# Patient Record
Sex: Female | Born: 1965 | Race: White | Hispanic: No | Smoking: Never smoker
Health system: Southern US, Community
[De-identification: ages and names within clinical notes are randomized; demographics above are authoritative.]

## PROBLEM LIST (undated history)

## (undated) DIAGNOSIS — Z801 Family history of malignant neoplasm of trachea, bronchus and lung: Secondary | ICD-10-CM

## (undated) DIAGNOSIS — D0511 Intraductal carcinoma in situ of right breast: Secondary | ICD-10-CM

## (undated) DIAGNOSIS — E119 Type 2 diabetes mellitus without complications: Secondary | ICD-10-CM

## (undated) DIAGNOSIS — F419 Anxiety disorder, unspecified: Secondary | ICD-10-CM

## (undated) DIAGNOSIS — Z808 Family history of malignant neoplasm of other organs or systems: Secondary | ICD-10-CM

## (undated) DIAGNOSIS — K589 Irritable bowel syndrome without diarrhea: Secondary | ICD-10-CM

## (undated) DIAGNOSIS — Z803 Family history of malignant neoplasm of breast: Secondary | ICD-10-CM

## (undated) DIAGNOSIS — D0512 Intraductal carcinoma in situ of left breast: Secondary | ICD-10-CM

## (undated) HISTORY — DX: Intraductal carcinoma in situ of right breast: D05.11

## (undated) HISTORY — DX: Family history of malignant neoplasm of trachea, bronchus and lung: Z80.1

## (undated) HISTORY — DX: Intraductal carcinoma in situ of left breast: D05.12

## (undated) HISTORY — DX: Irritable bowel syndrome, unspecified: K58.9

## (undated) HISTORY — DX: Family history of malignant neoplasm of breast: Z80.3

## (undated) HISTORY — DX: Family history of malignant neoplasm of other organs or systems: Z80.8

## (undated) HISTORY — DX: Type 2 diabetes mellitus without complications: E11.9

---

## 2002-12-23 ENCOUNTER — Other Ambulatory Visit: Admission: RE | Admit: 2002-12-23 | Discharge: 2002-12-23 | Payer: Self-pay | Admitting: *Deleted

## 2006-03-12 ENCOUNTER — Ambulatory Visit: Payer: Self-pay | Admitting: Orthopedic Surgery

## 2007-02-15 ENCOUNTER — Ambulatory Visit (HOSPITAL_COMMUNITY): Admission: RE | Admit: 2007-02-15 | Discharge: 2007-02-15 | Payer: Self-pay | Admitting: Obstetrics & Gynecology

## 2008-02-25 ENCOUNTER — Other Ambulatory Visit: Admission: RE | Admit: 2008-02-25 | Discharge: 2008-02-25 | Payer: Self-pay | Admitting: Obstetrics & Gynecology

## 2009-04-19 ENCOUNTER — Other Ambulatory Visit: Admission: RE | Admit: 2009-04-19 | Discharge: 2009-04-19 | Payer: Self-pay | Admitting: Obstetrics & Gynecology

## 2010-05-20 ENCOUNTER — Ambulatory Visit (HOSPITAL_COMMUNITY): Admission: RE | Admit: 2010-05-20 | Discharge: 2010-05-20 | Payer: Self-pay | Admitting: Obstetrics & Gynecology

## 2010-07-08 ENCOUNTER — Other Ambulatory Visit: Admission: RE | Admit: 2010-07-08 | Discharge: 2010-07-08 | Payer: Self-pay | Admitting: Obstetrics & Gynecology

## 2010-12-05 ENCOUNTER — Encounter: Payer: Self-pay | Admitting: Obstetrics & Gynecology

## 2016-06-26 ENCOUNTER — Encounter: Payer: Self-pay | Admitting: Nutrition

## 2016-06-26 ENCOUNTER — Encounter: Payer: BC Managed Care – PPO | Attending: Internal Medicine | Admitting: Nutrition

## 2016-06-26 VITALS — Ht 65.0 in | Wt 195.8 lb

## 2016-06-26 DIAGNOSIS — E1165 Type 2 diabetes mellitus with hyperglycemia: Secondary | ICD-10-CM

## 2016-06-26 DIAGNOSIS — E119 Type 2 diabetes mellitus without complications: Secondary | ICD-10-CM | POA: Diagnosis not present

## 2016-06-26 DIAGNOSIS — Z6832 Body mass index (BMI) 32.0-32.9, adult: Secondary | ICD-10-CM | POA: Diagnosis not present

## 2016-06-26 DIAGNOSIS — Z713 Dietary counseling and surveillance: Secondary | ICD-10-CM | POA: Diagnosis present

## 2016-06-26 DIAGNOSIS — E669 Obesity, unspecified: Secondary | ICD-10-CM

## 2016-06-26 DIAGNOSIS — IMO0002 Reserved for concepts with insufficient information to code with codable children: Secondary | ICD-10-CM

## 2016-06-26 DIAGNOSIS — E118 Type 2 diabetes mellitus with unspecified complications: Secondary | ICD-10-CM

## 2016-06-26 NOTE — Progress Notes (Signed)
Diabetes Self-Management Education  Visit Type:  Initial   Appt. Start Time: 800 Appt. End Time: 900  06/26/2016  Maureen Mahoney, identified by name and date of birth, is a 50 y.o. female with a diagnosis of Diabetes:  . Works in the school system. She notes she has made a lot of changes recently. Lost 10 lbs. Has cut back on snacks and junk food and drinking more water and trying to drink more water. Tests blood sugars in am sometimes. A1C   ASSESSMENT  Height 5\' 5"  (1.651 m), weight 195 lb 12.8 oz (88.8 kg). Body mass index is 32.58 kg/m.      Diabetes Self-Management Education - 06/26/16 0818      Health Coping   How would you rate your overall health? Fair     Psychosocial Assessment   Patient Belief/Attitude about Diabetes Motivated to manage diabetes   Self-care barriers None   Self-management support Friends;Family   Other persons present Patient   Patient Concerns Nutrition/Meal planning;Medication;Monitoring;Problem Solving;Glycemic Control;Healthy Lifestyle   Special Needs None   Preferred Learning Style Visual;Auditory   Learning Readiness Ready   How often do you need to have someone help you when you read instructions, pamphlets, or other written materials from your doctor or pharmacy? 1 - Never   What is the last grade level you completed in school? 18     Pre-Education Assessment   Patient understands the diabetes disease and treatment process. Needs Instruction   Patient understands incorporating nutritional management into lifestyle. Needs Instruction   Patient undertands incorporating physical activity into lifestyle. Needs Instruction   Patient understands using medications safely. Needs Instruction   Patient understands monitoring blood glucose, interpreting and using results Needs Instruction   Patient understands prevention, detection, and treatment of acute complications. Needs Instruction   Patient understands prevention, detection, and  treatment of chronic complications. Needs Instruction   Patient understands how to develop strategies to address psychosocial issues. Needs Instruction   Patient understands how to develop strategies to promote health/change behavior. Needs Instruction     Complications   Last HgB A1C per patient/outside source 8.3 %     Exercise   How many days per week to you exercise? 2   How many minutes per day do you exercise? 30   Total minutes per week of exercise 60     Patient Education   Previous Diabetes Education No   Disease state  Definition of diabetes, type 1 and 2, and the diagnosis of diabetes;Factors that contribute to the development of diabetes;Explored patient's options for treatment of their diabetes   Nutrition management  Role of diet in the treatment of diabetes and the relationship between the three main macronutrients and blood glucose level;Food label reading, portion sizes and measuring food.;Carbohydrate counting;Reviewed blood glucose goals for pre and post meals and how to evaluate the patients' food intake on their blood glucose level.;Meal timing in regards to the patients' current diabetes medication.;Information on hints to eating out and maintain blood glucose control.   Physical activity and exercise  Role of exercise on diabetes management, blood pressure control and cardiac health.;Identified with patient nutritional and/or medication changes necessary with exercise.;Helped patient identify appropriate exercises in relation to his/her diabetes, diabetes complications and other health issue.   Medications Reviewed patients medication for diabetes, action, purpose, timing of dose and side effects.   Monitoring Purpose and frequency of SMBG.;Taught/discussed recording of test results and interpretation of SMBG.;Interpreting lab values - A1C, lipid, urine  microalbumina.;Identified appropriate SMBG and/or A1C goals.   Acute complications Taught treatment of hypoglycemia - the  15 rule.;Discussed and identified patients' treatment of hyperglycemia.   Chronic complications Relationship between chronic complications and blood glucose control;Assessed and discussed foot care and prevention of foot problems;Lipid levels, blood glucose control and heart disease;Identified and discussed with patient  current chronic complications;Retinopathy and reason for yearly dilated eye exams;Nephropathy, what it is, prevention of, the use of ACE, ARB's and early detection of through urine microalbumia.;Reviewed with patient heart disease, higher risk of, and prevention   Psychosocial adjustment Worked with patient to identify barriers to care and solutions;Role of stress on diabetes;Helped patient identify a support system for diabetes management;Identified and addressed patients feelings and concerns about diabetes   Personal strategies to promote health Lifestyle issues that need to be addressed for better diabetes care;Helped patient develop diabetes management plan for (enter comment)     Individualized Goals (developed by patient)   Nutrition Follow meal plan discussed;General guidelines for healthy choices and portions discussed;Adjust meds/carbs with exercise as discussed   Physical Activity Exercise 3-5 times per week;45 minutes per day   Medications take my medication as prescribed   Monitoring  test my blood glucose as discussed;test blood glucose pre and post meals as discussed   Reducing Risk examine blood glucose patterns;get labs drawn;do foot checks daily;increase portions of healthy fats   Health Coping ask for help with (comment)  carb counting     Post-Education Assessment   Patient understands the diabetes disease and treatment process. Needs Review   Patient understands incorporating nutritional management into lifestyle. Needs Review   Patient undertands incorporating physical activity into lifestyle. Needs Review   Patient understands using medications safely. Needs  Review   Patient understands monitoring blood glucose, interpreting and using results Needs Review   Patient understands prevention, detection, and treatment of acute complications. Needs Review   Patient understands prevention, detection, and treatment of chronic complications. Needs Review   Patient understands how to develop strategies to address psychosocial issues. Needs Review   Patient understands how to develop strategies to promote health/change behavior. Needs Review     Outcomes   Expected Outcomes Demonstrated interest in learning. Expect positive outcomes   Future DMSE 4-6 wks   Program Status Completed      Individualized Plan for Diabetes Self-Management Training:   Learning Objective:  Patient will have a greater understanding of diabetes self-management. Patient education plan is to attend individual and/or group sessions per assessed needs and concerns.   Plan:   Patient Instructions  Goals Follow Plate Method Eat 2-3 carb choices per meal Exercise 30 minutes three times per week. Choose healthier option for breakfast Get A1C to 7%  Lose 1 -2 lbs per week   Expected Outcomes:  Demonstrated interest in learning. Expect positive outcomes  Education material provided: Living Well with Diabetes, Food label handouts, A1C conversion sheet, Meal plan card and My Plate  If problems or questions, patient to contact team via:  Phone and Email  Future DSME appointment: 4-6 wks

## 2016-06-26 NOTE — Patient Instructions (Signed)
Goals Follow Plate Method Eat 2-3 carb choices per meal Exercise 30 minutes three times per week. Choose healthier option for breakfast Get A1C to 7%  Lose 1 -2 lbs per week

## 2016-07-12 ENCOUNTER — Encounter: Payer: Self-pay | Admitting: Nutrition

## 2016-07-24 ENCOUNTER — Encounter: Payer: Self-pay | Admitting: Nutrition

## 2016-07-24 ENCOUNTER — Encounter: Payer: BC Managed Care – PPO | Attending: Internal Medicine | Admitting: Nutrition

## 2016-07-24 VITALS — Ht 65.0 in | Wt 194.0 lb

## 2016-07-24 DIAGNOSIS — Z6832 Body mass index (BMI) 32.0-32.9, adult: Secondary | ICD-10-CM | POA: Insufficient documentation

## 2016-07-24 DIAGNOSIS — E1165 Type 2 diabetes mellitus with hyperglycemia: Secondary | ICD-10-CM

## 2016-07-24 DIAGNOSIS — Z713 Dietary counseling and surveillance: Secondary | ICD-10-CM | POA: Diagnosis present

## 2016-07-24 DIAGNOSIS — IMO0002 Reserved for concepts with insufficient information to code with codable children: Secondary | ICD-10-CM

## 2016-07-24 DIAGNOSIS — E669 Obesity, unspecified: Secondary | ICD-10-CM

## 2016-07-24 DIAGNOSIS — E119 Type 2 diabetes mellitus without complications: Secondary | ICD-10-CM | POA: Insufficient documentation

## 2016-07-24 DIAGNOSIS — E118 Type 2 diabetes mellitus with unspecified complications: Secondary | ICD-10-CM

## 2016-07-24 NOTE — Progress Notes (Signed)
Diabetes Self-Management Education  Visit Type:  Follow-up  Appt. Start Time 800 Appt. End Time: 830 07/24/2016  Maureen Mahoney, identified by name and date of birth, is a 50 y.o. female with a diagnosis of Diabetes:  .  Just got back from vacation. Walked on [employer], watched what she ate and drank water. Is constipated and hasn't had BM for 3 days. Feels bloated. Clothes are fitting much looser, had to buy smaller size. Feels a lot better. Cut out snacks and junk food. Reading food labels. Meter down loaded. AVg BS 144 mg/dl. Excellent progress.  ASSESSMENT  Height 5\' 5"  (1.651 m), weight 194 lb (88 kg). Body mass index is 32.28 kg/m.       Diabetes Self-Management Education - 07/24/16 0844      Health Coping   How would you rate your overall health? Good     Psychosocial Assessment   Patient Belief/Attitude about Diabetes Motivated to manage diabetes   Self-care barriers None     Pre-Education Assessment   Patient understands the diabetes disease and treatment process. Demonstrates understanding / competency   Patient understands incorporating nutritional management into lifestyle. Demonstrates understanding / competency   Patient undertands incorporating physical activity into lifestyle. Demonstrates understanding / competency   Patient understands using medications safely. Demonstrates understanding / competency   Patient understands monitoring blood glucose, interpreting and using results Demonstrates understanding / competency   Patient understands prevention, detection, and treatment of acute complications. Demonstrates understanding / competency   Patient understands prevention, detection, and treatment of chronic complications. Demonstrates understanding / competency   Patient understands how to develop strategies to address psychosocial issues. Demonstrates understanding / competency   Patient understands how to develop strategies to promote health/change behavior.  Demonstrates understanding / competency     Complications   How often do you check your blood sugar? 1-2 times/day   Fasting Blood glucose range (mg/dL) 70-129   Postprandial Blood glucose range (mg/dL) 70-129   Number of hypoglycemic episodes per month 0   Number of hyperglycemic episodes per week 0   Are you checking your feet? Yes   How many days per week are you checking your feet? 7     Dietary Intake   Breakfast Oatmeal, eggs, water   Lunch seafood and salad grilled, water   Snack (afternoon) nuts   Dinner meat, low carb vegetables. water   Snack (evening) water     Exercise   How many days per week to you exercise? 4   How many minutes per day do you exercise? 15   Total minutes per week of exercise 60     Patient Education   Previous Diabetes Education Yes (please comment)   Nutrition management  Carbohydrate counting;Food label reading, portion sizes and measuring food.;Meal timing in regards to the patients' current diabetes medication.   Physical activity and exercise  Role of exercise on diabetes management, blood pressure control and cardiac health.;Identified with patient nutritional and/or medication changes necessary with exercise.;Helped patient identify appropriate exercises in relation to his/her diabetes, diabetes complications and other health issue.   Monitoring Purpose and frequency of SMBG.;Interpreting lab values - A1C, lipid, urine microalbumina.   Psychosocial adjustment Role of stress on diabetes;Travel strategies   Personal strategies to promote health Lifestyle issues that need to be addressed for better diabetes care     Individualized Goals (developed by patient)   Physical Activity Exercise 3-5 times per week;15 minutes per day   Medications take my medication  as prescribed   Monitoring  test my blood glucose as discussed     Patient Self-Evaluation of Goals - Patient rates self as meeting previously set goals (% of time)   Nutrition >75%    Physical Activity >75%   Medications >75%   Monitoring >75%   Problem Solving >75%   Reducing Risk >75%   Health Coping >75%     Post-Education Assessment   Patient understands the diabetes disease and treatment process. Demonstrates understanding / competency   Patient understands incorporating nutritional management into lifestyle. Demonstrates understanding / competency   Patient undertands incorporating physical activity into lifestyle. Demonstrates understanding / competency   Patient understands using medications safely. Demonstrates understanding / competency   Patient understands monitoring blood glucose, interpreting and using results Demonstrates understanding / competency   Patient understands prevention, detection, and treatment of acute complications. Demonstrates understanding / competency   Patient understands prevention, detection, and treatment of chronic complications. Demonstrates understanding / competency   Patient understands how to develop strategies to address psychosocial issues. Demonstrates understanding / competency   Patient understands how to develop strategies to promote health/change behavior. Demonstrates understanding / competency     Outcomes   Program Status Completed     Subsequent Visit   Since your last visit have you continued or begun to take your medications as prescribed? Yes   Since your last visit have you had your blood pressure checked? Yes   Is your most recent blood pressure lower, unchanged, or higher since your last visit? Lower   Since your last visit have you experienced any weight changes? Loss   Weight Loss (lbs) 5   Since your last visit, are you checking your blood glucose at least once a day? Yes      Learning Objective:  Patient will have a greater understanding of diabetes self-management. Patient education plan is to attend individual and/or group sessions per assessed needs and concerns.   Plan:   Patient Instructions   Goals 1. Increase physical activty with 30 minutes 3-4 times per week. 2. Increase high fiber foods to help constipation 3. Increase 5-8 oz of bottles of water per day 4. Eat 30-45 grams of carbs per meals. 5 Lose 1 lb per week.  6. Get A1C to 7% or less.  Try Mirlalax to aid bowels.    Expected Outcomes:  Demonstrated interest in learning. Expect positive outcomes  Education material provided: A1C conversion sheet, My Plate and Carbohydrate counting sheet  If problems or questions, patient to contact team via:  Phone and Email  Future DSME appointment: - 3-4 months

## 2016-07-24 NOTE — Patient Instructions (Signed)
Goals 1. Increase physical activty with 30 minutes 3-4 times per week. 2. Increase high fiber foods to help constipation 3. Increase 5-8 oz of bottkes per day 4. Eat 30-45 grams of carbs per meals. 5 Lose 1 lb per week.  6. Get A1C to 7% or less.

## 2016-09-25 ENCOUNTER — Encounter: Payer: BC Managed Care – PPO | Attending: Internal Medicine | Admitting: Nutrition

## 2016-09-25 ENCOUNTER — Encounter: Payer: Self-pay | Admitting: Nutrition

## 2016-09-25 DIAGNOSIS — Z6831 Body mass index (BMI) 31.0-31.9, adult: Secondary | ICD-10-CM | POA: Diagnosis not present

## 2016-09-25 DIAGNOSIS — E119 Type 2 diabetes mellitus without complications: Secondary | ICD-10-CM | POA: Insufficient documentation

## 2016-09-25 DIAGNOSIS — Z713 Dietary counseling and surveillance: Secondary | ICD-10-CM | POA: Insufficient documentation

## 2016-09-25 NOTE — Patient Instructions (Addendum)
Goals 1. Will exercise 3 times per week. 2. Look into [employer] 3. Decrease diet sodas and increase water 4. Lose 1 lb per week. Get A1C down to 6% or less in 6 months. Contnue miralax as recommended to stay regular.

## 2016-09-25 NOTE — Progress Notes (Signed)
Diabetes Self-Management Education  Visit Type:  Follow-up  Appt. Start Time: 0800 Appt. End Time: 0900  09/25/2016  Maureen Mahoney, identified by name and date of birth, is a 50 y.o. female with a diagnosis of Diabetes: She has lost about 10 lbs since she began coming here.   Changes made: Drinking water. Drinks unsweet tea. Eating more yogurt. She has stopped her probiotic but devolped a yeast infection. So she went back to taking it.. She is no longer having diarrhea issues.  A1C  6.7%, down from 8.3%. Has found that gum increases her craving for sweets.. Metformin 500 mg BID. Stopped Docyclinie.-IBS med. More constipation and now using MIralax to regulate bowels. Needs to exercise.     Has a spell with Shingles 2  months ago. Diet is improving and she is doing a great job. Needs better balanced meals of 2-3 carb choices per meal and more high fiber foods and low carb vegetables. ASSESSMENT  Height 5\' 5"  (1.651 m), weight 186 lb 12.8 oz (84.7 kg). Body mass index is 31.09 kg/m.       Diabetes Self-Management Education - [id number]      Complications   Last HgB A1C per patient/outside source 6.7 %   Fasting Blood glucose range (mg/dL) 70-129   Postprandial Blood glucose range (mg/dL) 130-179     Dietary Intake   Breakfast Yogurt or raisin bran cereal, milk   Lunch Eggs, 2 slices toast, tenderloin, unsweet tea   Dinner Chicken tenders, baked potato and green beans, water   Beverage(s) water     Exercise   How many days per week to you exercise? 0     Patient Education   Disease state  Explored patient's options for treatment of their diabetes   Nutrition management  Role of diet in the treatment of diabetes and the relationship between the three main macronutrients and blood glucose level;Food label reading, portion sizes and measuring food.;Carbohydrate counting;Reviewed blood glucose goals for pre and post meals and how to evaluate the patients' food intake on  their blood glucose level.;Meal options for control of blood glucose level and chronic complications.   Physical activity and exercise  Role of exercise on diabetes management, blood pressure control and cardiac health.;Identified with patient nutritional and/or medication changes necessary with exercise.;Helped patient identify appropriate exercises in relation to his/her diabetes, diabetes complications and other health issue.   Monitoring Purpose and frequency of SMBG.;Interpreting lab values - A1C, lipid, urine microalbumina.;Identified appropriate SMBG and/or A1C goals.   Acute complications Taught treatment of hypoglycemia - the 15 rule.   Chronic complications Retinopathy and reason for yearly dilated eye exams;Identified and discussed with patient  current chronic complications;Nephropathy, what it is, prevention of, the use of ACE, ARB's and early detection of through urine microalbumia.;Dental care;Lipid levels, blood glucose control and heart disease   Psychosocial adjustment Worked with patient to identify barriers to care and solutions;Role of stress on diabetes;Helped patient identify a support system for diabetes management;Travel strategies;Brainstormed with patient on coping mechanisms for social situations, getting support from significant others, dealing with feelings about diabetes   Personal strategies to promote health Lifestyle issues that need to be addressed for better diabetes care     Individualized Goals (developed by patient)   Nutrition Follow meal plan discussed;General guidelines for healthy choices and portions discussed;Adjust meds/carbs with exercise as discussed   Physical Activity Exercise 3-5 times per week;15 minutes per day   Monitoring  test my blood glucose as  discussed   Reducing Risk examine blood glucose patterns;get labs drawn;do foot checks daily;treat hypoglycemia with 15 grams of carbs if blood glucose less than 70mg /dL;increase portions of nuts and seeds      Patient Self-Evaluation of Goals - Patient rates self as meeting previously set goals (% of time)   Nutrition >75%   Physical Activity < 25%   Medications >75%   Monitoring >75%   Problem Solving >75%   Reducing Risk >75%   Health Coping >75%     Post-Education Assessment   Patient understands the diabetes disease and treatment process. Demonstrates understanding / competency   Patient understands incorporating nutritional management into lifestyle. Demonstrates understanding / competency   Patient undertands incorporating physical activity into lifestyle. Demonstrates understanding / competency   Patient understands using medications safely. Demonstrates understanding / competency   Patient understands monitoring blood glucose, interpreting and using results Demonstrates understanding / competency   Patient understands prevention, detection, and treatment of acute complications. Demonstrates understanding / competency   Patient understands prevention, detection, and treatment of chronic complications. Demonstrates understanding / competency   Patient understands how to develop strategies to address psychosocial issues. Demonstrates understanding / competency   Patient understands how to develop strategies to promote health/change behavior. Demonstrates understanding / competency     Outcomes   Program Status Completed      Learning Objective:  Patient will have a greater understanding of diabetes self-management. Patient education plan is to attend individual and/or group sessions per assessed needs and concerns.   Plan:   Patient Instructions  Goals 1. Will exercise 3 times per week. 2. Look into [employer] 3. Decrease diet sodas and increase water 4. Lose 1 lb per week. Get A1C down to 6% or less in 6 months. Contnue miralax as recommended to stay regular.    Expected Outcomes:  Demonstrated interest in learning. Expect positive outcomes  Education material  provided: Meal plan card and My Plate  If problems or questions, patient to contact team via:  Phone and Email  Future DSME appointment: - 3-4 months

## 2017-01-08 ENCOUNTER — Ambulatory Visit: Payer: BC Managed Care – PPO | Admitting: Nutrition

## 2018-02-20 ENCOUNTER — Other Ambulatory Visit: Payer: Self-pay | Admitting: Obstetrics and Gynecology

## 2018-02-20 DIAGNOSIS — R921 Mammographic calcification found on diagnostic imaging of breast: Secondary | ICD-10-CM

## 2018-02-20 DIAGNOSIS — N6320 Unspecified lump in the left breast, unspecified quadrant: Secondary | ICD-10-CM

## 2018-02-27 ENCOUNTER — Ambulatory Visit
Admission: RE | Admit: 2018-02-27 | Discharge: 2018-02-27 | Disposition: A | Discharge status: Home / Self Care | Payer: BC Managed Care – PPO | Source: Ambulatory Visit | Attending: Obstetrics and Gynecology | Admitting: Obstetrics and Gynecology

## 2018-02-27 DIAGNOSIS — R921 Mammographic calcification found on diagnostic imaging of breast: Secondary | ICD-10-CM

## 2018-02-27 DIAGNOSIS — N6320 Unspecified lump in the left breast, unspecified quadrant: Secondary | ICD-10-CM

## 2018-03-20 ENCOUNTER — Inpatient Hospital Stay: Payer: BC Managed Care – PPO

## 2018-03-20 ENCOUNTER — Encounter: Payer: Self-pay | Admitting: Genetics

## 2018-03-20 ENCOUNTER — Inpatient Hospital Stay: Payer: BC Managed Care – PPO | Attending: Genetic Counselor | Admitting: Genetics

## 2018-03-20 DIAGNOSIS — Z808 Family history of malignant neoplasm of other organs or systems: Secondary | ICD-10-CM

## 2018-03-20 DIAGNOSIS — Z1379 Encounter for other screening for genetic and chromosomal anomalies: Secondary | ICD-10-CM

## 2018-03-20 DIAGNOSIS — D0512 Intraductal carcinoma in situ of left breast: Secondary | ICD-10-CM

## 2018-03-20 DIAGNOSIS — D0511 Intraductal carcinoma in situ of right breast: Secondary | ICD-10-CM

## 2018-03-20 DIAGNOSIS — Z803 Family history of malignant neoplasm of breast: Secondary | ICD-10-CM

## 2018-03-20 DIAGNOSIS — Z801 Family history of malignant neoplasm of trachea, bronchus and lung: Secondary | ICD-10-CM | POA: Insufficient documentation

## 2018-03-20 HISTORY — DX: Intraductal carcinoma in situ of left breast: D05.12

## 2018-03-20 HISTORY — DX: Intraductal carcinoma in situ of right breast: D05.11

## 2018-03-20 NOTE — Progress Notes (Signed)
REFERRING PROVIDER: Rolm Bookbinder, MD [address] [place], [place] [address]  PRIMARY PROVIDER:  Asencion Noble, MD  PRIMARY REASON FOR VISIT:  1. Family history of breast cancer   2. Family history of lung cancer   3. Family history of melanoma     HISTORY OF PRESENT ILLNESS:   Maureen Mahoney, a 52 y.o. female, was seen for a [facility] cancer genetics consultation at the request of Dr. Donne Hazel due to a personal and family history of breast cancer.  Maureen Mahoney presents to clinic today to discuss the possibility of a hereditary predisposition to cancer, genetic testing, and to further clarify her future cancer risks, as well as potential cancer risks for family members.   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   She is currently considering surgical and treatment options.   HORMONAL RISK FACTORS:  First live birth at age N/A.  OCP use for approximately 0 years.  Ovaries intact: yes.  Hysterectomy: no.  Colonoscopy: no; not examined. Mammogram within the last year: yes.  Past Medical History:  Diagnosis Date  . Diabetes mellitus without complication ([place])   . 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 Family history of breast cancer   . Family history of lung cancer   . Family history of melanoma   . Irritable bowel disease     No past surgical history on file.  Social History   Socioeconomic History  . Marital status: Divorced    Spouse name: Not on file  . Number of children: Not on file  . Years of education: Not on file  . Highest education level: Not on file  Occupational History  . Not on file  Social Needs  . Financial resource strain: Not on file  . Food insecurity:    Worry: Not on file    Inability: Not on file  . Transportation needs:    Medical: Not on file    Non-medical: Not on file  Tobacco Use  .  Smoking status: Never Smoker  . Smokeless tobacco: Never Used  Substance and Sexual Activity  . Alcohol use: Not on file  . Drug use: Not on file  . Sexual activity: Not on file  Lifestyle  . Physical activity:    Days per week: Not on file    Minutes per session: Not on file  . Stress: Not on file  Relationships  . Social connections:    Talks on phone: Not on file    Gets together: Not on file    Attends religious service: Not on file    Active member of club or organization: Not on file    Attends meetings of clubs or organizations: Not on file    Relationship status: Not on file  Other Topics Concern  . Not on file  Social History Narrative  . Not on file     FAMILY HISTORY:  We obtained a detailed, 4-generation family history.  Significant diagnoses are listed below: Family History  Problem Relation Age of Onset  . Lung cancer Mother 75  . Heart disease Maternal Uncle   . Heart disease Paternal Aunt   . Other Maternal Grandmother        pituitary tumor, not cancer  . Parkinson's disease Maternal Grandfather   . Breast cancer Other 80  . Breast cancer  Other 50  . Breast cancer Other 31   Maureen Mahoney has no children.  Maureen Mahoney has a 86 year-old sister with no history of cancer.  This sister has a son.   Maureen Mahoney father: alive , in his 86's, history of melanoma in his 45's.  Paternal aunts/Uncles: 2 paternal half-uncles and 1 paternal half-aunt. Pat half-aunt deceased due to heart disease.  Paternal cousins: no history of cancer.  Paternal grandfather: deceased, no history of cancer.  He has a sister who developed breast cancer >50.  Paternal grandmother:limited information  Maureen Mahoney's mother: died of lung cancer at 35, hx of smoking.  Uterus and ovaries intact Maternal Aunts/Uncles: 1 maternal uncle, no history of cancer- heart disease.  He has 3 children.  Maternal cousins: no history of cancer.  Maternal grandfather: parkinson's disease,  died in 29's. He had a sister who had breast cancer in  Her 3's.  This grandfather's sister had a daughter (patient's cousin 1x removed) with breast cancer dx at 77.  Maternal grandmother:pituitary tumor, not cancer.  She had a sister who had lymphoma.   Maureen Mahoney is unaware of previous family history of genetic testing for hereditary cancer risks. Patient's maternal ancestors are of Northern European descent, and paternal ancestors are of Northern European/French descent. There is no reported Ashkenazi Jewish ancestry. There is no known consanguinity.  GENETIC COUNSELING ASSESSMENT: Maureen Mahoney is a 52 y.o. female with a personal and family history which is somewhat suggestive of a Hereditary Cancer Predisposition Syndrome. We, therefore, discussed and recommended the following at today's visit.   DISCUSSION: We reviewed the characteristics, features and inheritance patterns of hereditary cancer syndromes. We also discussed genetic testing, including the appropriate family members to test, the process of testing, insurance coverage and turn-around-time for results. We discussed the implications of a negative, positive and/or variant of uncertain significant result. We recommended Maureen Mahoney pursue genetic testing for the Common Hereditary Cancesr gene panel + melanoma panel.   The Common Hereditary Cancer Panel offered by [facility] includes sequencing and/or deletion duplication testing of the following 47 genes: APC, ATM, AXIN2, BARD1, BMPR1A, BRCA1, BRCA2, BRIP1, CDH1, CDKN2A (p14ARF), CDKN2A (p16INK4a), CKD4, CHEK2, CTNNA1, DICER1, EPCAM (Deletion/duplication testing only), GREM1 (promoter region deletion/duplication testing only), KIT, MEN1, MLH1, MSH2, MSH3, MSH6, MUTYH, NBN, NF1, NHTL1, PALB2, PDGFRA, PMS2, POLD1, POLE, PTEN, RAD50, RAD51C, RAD51D, SDHB, SDHC, SDHD, SMAD4, SMARCA4. STK11, TP53, TSC1, TSC2, and VHL.  The following genes were evaluated for sequence changes only: SDHA  and HOXB13 c.251G>A variant only.  The [facility] Melanoma Panel analyzed the following 9 genes: BAP1 BRCA2 CDK4 CDKN2A MITF POT1 PTEN RB1 Tp53,   We discussed that only 5-10% of cancers are associated with a Hereditary cancer predisposition syndrome.  One of the most common hereditary cancer syndromes that increases breast cancer risk is called Hereditary Breast and Ovarian Cancer (HBOC) syndrome.  This syndrome is caused by mutations in the BRCA1 and BRCA2 genes.  This syndrome increases an individual's lifetime risk to develop breast, ovarian, pancreatic, and other types of cancer.  There are also many other cancer predisposition syndromes caused by mutations in several other genes.  We discussed that if she is found to have a mutation in one of these genes, it may impact surgical decisions, and alter future medical management recommendations such as increased cancer screenings and consideration of risk reducing surgeries.  A positive result could also have implications for the patient's family members.  A Negative result would mean we  were unable to identify a hereditary component to her cancer, but does not rule out the possibility of a hereditary basis for her cancer.  There could be mutations that are undetectable by current technology, or in genes not yet tested or identified to increase cancer risk.    We discussed the potential to find a Variant of Uncertain Significance or VUS.  These are variants that have not yet been identified as pathogenic or benign, and it is unknown if this variant is associated with increased cancer risk or if this is a normal finding.  Most VUS's are reclassified to benign or likely benign.   It should not be used to make medical management decisions. With time, we suspect the lab will determine the significance of any VUS's identified if any.   Based on Maureen Mahoney's personal and family history of cancer, she meets medical criteria for genetic testing. Despite that  she meets criteria, she may still have an out of pocket cost. The laboratory will contact her about the estimated OOP cost and offer the patient pay $250 price as well.   PLAN: After considering the risks, benefits, and limitations, Maureen Mahoney  provided informed consent to pursue genetic testing and the blood sample was sent to [facility] for analysis of the Common Hereditary Cancers Panel + Melanoma Panel. Results should be available within approximately 2 weeks' time, at which point they will be disclosed by telephone to Maureen Mahoney, as will any additional recommendations warranted by these results. Maureen Mahoney will receive a summary of her genetic counseling visit and a copy of her results once available. This information will also be available in Epic. We encouraged Maureen Mahoney to remain in contact with cancer genetics annually so that we can continuously update the family history and inform her of any changes in cancer genetics and testing that may be of benefit for her family. Maureen Mahoney questions were answered to her satisfaction today. Our contact information was provided should additional questions or concerns arise.  Lastly, we encouraged Maureen Mahoney to remain in contact with cancer genetics annually so that we can continuously update the family history and inform her of any changes in cancer genetics and testing that may be of benefit for this family.   Ms.  Mahoney questions were answered to her satisfaction today. Our contact information was provided should additional questions or concerns arise. Thank you for the referral and allowing [place] to share in the care of your patient.   Tana Felts, MS, [facility] Certified Genetic Counselor [email or web address] phone: [phone]  The patient was seen for a total of 30 minutes in face-to-face genetic counseling.  The patient was accompanied today by Elta Guadeloupe. This patient was discussed with Drs. Magrinat,  Lindi Adie and/or Burr Medico who agrees with the above.

## 2018-03-21 ENCOUNTER — Other Ambulatory Visit: Payer: Self-pay | Admitting: General Surgery

## 2018-03-21 DIAGNOSIS — D0512 Intraductal carcinoma in situ of left breast: Secondary | ICD-10-CM

## 2018-03-28 ENCOUNTER — Telehealth: Payer: Self-pay | Admitting: Genetics

## 2018-03-28 NOTE — Telephone Encounter (Signed)
Revealed negative genetic testing.  This normal result is reassuring and indicates that it is unlikely Maureen Mahoney's cancer is due to a hereditary cause.  It is unlikely that there is an increased risk of another cancer due to a mutation in one of these genes.  However, genetic testing is not perfect, and cannot definitively rule out a hereditary cause.  It will be important for her to keep in contact with genetics to learn if any additional testing may be needed in the future.

## 2018-04-01 ENCOUNTER — Ambulatory Visit: Payer: Self-pay | Admitting: Genetics

## 2018-04-01 ENCOUNTER — Encounter: Payer: Self-pay | Admitting: Genetics

## 2018-04-01 DIAGNOSIS — Z801 Family history of malignant neoplasm of trachea, bronchus and lung: Secondary | ICD-10-CM

## 2018-04-01 DIAGNOSIS — Z803 Family history of malignant neoplasm of breast: Secondary | ICD-10-CM

## 2018-04-01 DIAGNOSIS — Z1379 Encounter for other screening for genetic and chromosomal anomalies: Secondary | ICD-10-CM | POA: Insufficient documentation

## 2018-04-01 DIAGNOSIS — D0512 Intraductal carcinoma in situ of left breast: Secondary | ICD-10-CM

## 2018-04-01 DIAGNOSIS — D0511 Intraductal carcinoma in situ of right breast: Secondary | ICD-10-CM

## 2018-04-01 DIAGNOSIS — Z808 Family history of malignant neoplasm of other organs or systems: Secondary | ICD-10-CM

## 2018-04-01 NOTE — Progress Notes (Signed)
HPI:  [name] was previously seen in the [facility] on 03/20/2018 due to a personal and family history of cancer and concerns regarding a hereditary predisposition to cancer. Please refer to our prior cancer genetics clinic note for more information regarding [name]'s medical, social and family histories, and our assessment and recommendations, at the time. [name] recent genetic test results were disclosed to her, as well as recommendations warranted by these results. These results and recommendations are discussed in more detail below.  CANCER HISTORY:  On 02/27/2018, at the age of 52, [name] was diagnosed with bilateral DCIS ER +, PR+.   She is currently considering surgical and treatment options.    FAMILY HISTORY:  We obtained a detailed, 4-generation family history.  Significant diagnoses are listed below: Family History  Problem Relation Age of Onset  . Lung cancer Mother 76  . Heart disease Maternal Uncle   . Heart disease Paternal Aunt   . Other Maternal Grandmother        pituitary tumor, not cancer  . Parkinson's disease Maternal Grandfather   . Breast cancer Other 80  . Breast cancer Other 50  . Breast cancer Other 80  . Lymphoma Other     [name] has no children.  [name] has a 27 year-old sister with no history of cancer.  This sister has a son.   [name] father: alive , in his 40's, history of melanoma in his 14's.  Paternal aunts/Uncles: 2 paternal half-uncles and 1 paternal half-aunt. Pat half-aunt deceased due to heart disease.  Paternal cousins: no history of cancer.  Paternal grandfather: deceased, no history of cancer.  He has a sister who developed breast cancer >50.  Paternal grandmother:limited information  [name]'s mother: died of lung cancer at 32, hx of smoking.  Uterus and ovaries intact Maternal Aunts/Uncles: 1 maternal uncle, no history of cancer- heart disease.  He has 3  children.  Maternal cousins: no history of cancer.  Maternal grandfather: parkinson's disease, died in 60's. He had a sister who had breast cancer in  Her 24's.  This grandfather's sister had a daughter (patient's cousin 1x removed) with breast cancer dx at 3.  Maternal grandmother:pituitary tumor, not cancer.  She had a sister who had lymphoma.   [name] is unaware of previous family history of genetic testing for hereditary cancer risks. Patient's maternal ancestors are of Northern European descent, and paternal ancestors are of Northern European/French descent. There is no reported Ashkenazi Jewish ancestry. There is no known consanguinity.  GENETIC TEST RESULTS: Genetic testing performed through [facility]'s Common Hereditary Cancers Panel + melanoma Panel reported out on 03/28/2018 showed no pathogenic mutations. The following genes were evaluated for sequence changes and exonic deletions/duplications: APC, ATM, AXIN2, BAP1, BARD1, BMPR1A, BRCA1, BRCA2, BRIP1, CDH1, CDK4, CDKN2A (p14ARF), CDKN2A (p16INK4a), CHEK2, CTNNA1, DICER1, EPCAM*, GREM1*, KIT, MEN1, MLH1, MSH2, MSH3, MSH6, MUTYH, NBN, NF1, PALB2, PDGFRA, PMS2, POLD1, POLE, POT1, PTEN, RAD50, RAD51C, RAD51D, RB1, SDHB, SDHC, SDHD, SMAD4, SMARCA4, STK11, TP53, TSC1, TSC2, VHL. The following genes were evaluated for sequence changes only: HOXB13*, MITF*, NTHL1*, SDHA.  The test report will be scanned into EPIC and will be located under the Molecular Pathology section of the Results Review tab. A portion of the result report is included below for reference.     We discussed with [name] that because current genetic testing is not perfect, it is possible there may be a gene mutation in one  of these genes that current testing cannot detect, but that chance is small.  We also discussed, that there could be another gene that has not yet been discovered, or that we have not yet tested, that is responsible for the cancer diagnoses in the  family. It is also possible there is a hereditary cause for the cancer in the family that [name] did not inherit and therefore was not identified in her testing.  Therefore, it is important to remain in touch with cancer genetics in the future so that we can continue to offer [name] the most up to date genetic testing.      ADDITIONAL GENETIC TESTING: We discussed with [name] that there are other genes that are associated with increased cancer risk that can be analyzed. The laboratories that offer this testing look at these additional genes via a hereditary cancer gene panel. Should [name] wish to pursue additional genetic testing, we are happy to discuss and coordinate this testing, at any time.    CANCER SCREENING RECOMMENDATIONS: [name] test result is negative (normal).  This means that we have not identified a hereditary cause for her personal and family history of cancer at this time.   While reassuring, this does not definitively rule out a hereditary predisposition to cancer. It is still possible that there could be genetic mutations that are undetectable by current technology, or genetic mutations in genes that have not been tested or identified to increase cancer risk.  Therefore, it is recommended she continue to follow the cancer management and screening guidelines provided by her oncology and primary healthcare provider. An individual's cancer risk is not determined by genetic test results alone.  Overall cancer risk assessment includes additional factors such as personal medical history, family history, etc.  These should be used to make a personalized plan for cancer prevention and surveillance.     RECOMMENDATIONS FOR FAMILY MEMBERS:  Relatives in this family might be at some increased risk of developing cancer, over the general population risk, simply due to the family history of cancer.  We recommended women in this family have a yearly mammogram  beginning at age 55, or 60 years younger than the earliest onset of cancer, an annual clinical breast exam, and perform monthly breast self-exams. Women in this family should also have a gynecological exam as recommended by their primary provider. All family members should have a colonoscopy by age 6 (or as directed by their doctors).  All family members should inform their physicians about the family history of cancer so their doctors can make the most appropriate screening recommendations for them.   It is also possible there is a hereditary cause for the cancer in Ms. Samek's family that she did not inherit and therefore was not identified in her.   Ms. Payes's maternal relatives could also consider having genetic counseling and testing. Ms. Stinnette will let us know if we can be of any assistance in coordinating genetic counseling and/or testing for these family members.   FOLLOW-UP: Lastly, we discussed with Ms. Shiel that cancer genetics is a rapidly advancing field and it is possible that new genetic tests will be appropriate for her and/or her family members in the future. We encouraged her to remain in contact with cancer genetics on an annual basis so we can update her personal and family histories and let her know of advances in cancer genetics that may benefit this family.   Our contact number was provided. Ms.  Walen's questions were answered to her satisfaction, and she knows she is welcome to call us at anytime with additional questions or concerns.   Ferol Luz, MS, [facility] Certified Genetic Counselor [email or web address]

## 2018-04-06 ENCOUNTER — Other Ambulatory Visit: Payer: BC Managed Care – PPO

## 2018-04-06 ENCOUNTER — Ambulatory Visit
Admission: RE | Admit: 2018-04-06 | Discharge: 2018-04-06 | Disposition: A | Discharge status: Home / Self Care | Payer: BC Managed Care – PPO | Source: Ambulatory Visit | Attending: General Surgery | Admitting: General Surgery

## 2018-04-06 DIAGNOSIS — D0512 Intraductal carcinoma in situ of left breast: Secondary | ICD-10-CM

## 2018-04-06 MED ORDER — GADOBENATE DIMEGLUMINE 529 MG/ML IV SOLN
18.0000 mL | Freq: Once | INTRAVENOUS | Status: AC | PRN
  Administered 2018-04-06: 18 mL via INTRAVENOUS

## 2018-04-10 ENCOUNTER — Other Ambulatory Visit: Payer: Self-pay | Admitting: General Surgery

## 2018-04-10 DIAGNOSIS — R9389 Abnormal findings on diagnostic imaging of other specified body structures: Secondary | ICD-10-CM

## 2018-04-11 ENCOUNTER — Ambulatory Visit
Admission: RE | Admit: 2018-04-11 | Discharge: 2018-04-11 | Disposition: A | Discharge status: Home / Self Care | Payer: BC Managed Care – PPO | Source: Ambulatory Visit | Attending: General Surgery | Admitting: General Surgery

## 2018-04-11 DIAGNOSIS — R9389 Abnormal findings on diagnostic imaging of other specified body structures: Secondary | ICD-10-CM

## 2018-04-15 ENCOUNTER — Ambulatory Visit
Admission: RE | Admit: 2018-04-15 | Discharge: 2018-04-15 | Disposition: A | Discharge status: Home / Self Care | Payer: BC Managed Care – PPO | Source: Ambulatory Visit | Attending: General Surgery | Admitting: General Surgery

## 2018-04-15 DIAGNOSIS — R9389 Abnormal findings on diagnostic imaging of other specified body structures: Secondary | ICD-10-CM

## 2018-04-15 MED ORDER — GADOBENATE DIMEGLUMINE 529 MG/ML IV SOLN
18.0000 mL | Freq: Once | INTRAVENOUS | Status: AC | PRN
  Administered 2018-04-15: 18 mL via INTRAVENOUS

## 2018-04-23 ENCOUNTER — Other Ambulatory Visit: Payer: Self-pay | Admitting: General Surgery

## 2018-04-23 DIAGNOSIS — D0511 Intraductal carcinoma in situ of right breast: Secondary | ICD-10-CM

## 2018-04-23 DIAGNOSIS — D0512 Intraductal carcinoma in situ of left breast: Secondary | ICD-10-CM

## 2018-05-02 ENCOUNTER — Encounter (HOSPITAL_BASED_OUTPATIENT_CLINIC_OR_DEPARTMENT_OTHER): Payer: Self-pay | Admitting: *Deleted

## 2018-05-02 ENCOUNTER — Other Ambulatory Visit: Payer: Self-pay

## 2018-05-02 NOTE — Pre-Procedure Instructions (Signed)
Pt is coming Tuesday, June 25th for BMET, EKG and to pick up Ensure. Instructed to bring all medications and pack an overnight bag.

## 2018-05-06 NOTE — H&P (Signed)
Subjective:     Patient ID: Maureen Mahoney Mahoney is a 52 y.o. female.  HPI  Patient of Drs. Maureen Mahoney Mahoney here for consultation breast reconstruction. Presented with palpable left breast mass. MMG/US with 0.4 cm RETROAREOLAR LEFT breast mass and  1.5 cm UPPER-OUTER LEFT breast mass. The 2 clips are separated by a distance of 4 cm. Over right breast UOQ calcifications noted. 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  Biopsy of RIGHT breast UO with DCIS. All ER/PR+. She reports left nipple discharge, this was present with MMG but has continued.  MRI showed biopsy-proven RIGHT breast UOQ DCIS corresponds to 1.7 cm linear stippled enhancement. RIGHT LIQ NME present.  Over LEFT breast, biopsy-proven left breast 2 o'clock DCIS presents as 1.6 cm enhancing mass, with surrounding NEM measuring 2.7 Cm. Biopsy-proven left subareolar breast DCIS presents as 3 mmprogressively enhancing nodule. 6 mm enhancing nodule in the left 5:30 o'clock breast noted. No evidence of axillary lymphadenopathy.  Additional MR biopsies RIGHT breast LIQ with intermediate grde DCIS, ER/PR+. LEFT breast 530 biopsy benign- may be discordant due to patient movement.  Mastectomies recommended given span of disease.   Genetics negative.  Current "barely C," desires full C. Wt stable  PMH includes DM, last HbA1c reports between 6-7.   Accompanied by sister that is RN and instructs at [employer] and step father. 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   Review of Systems Remainder 12 point review negative    Objective:   Physical Exam  Constitutional: She is oriented to person, place, and time.  Cardiovascular: Normal rate, regular rhythm and normal heart sounds.   Pulmonary/Chest: Effort normal and breath sounds normal.   Lymphadenopathy:    She has no axillary adenopathy.  Neurological: She is alert and oriented to person, place, and time.  Skin:  Fitzpatrick 2   Right grade II ptosis, left grade 1 Left breast mass 3 o clock  Sn to nipple R 24 L 23 BW R 18 (CW 13) L 18 cm Nipple to IMF R 7.5 L 9 cm Midline to nipple R 10.5 L 13 cm    Assessment:     DCIS bilateral breast Family history breast cancer    Plan:     Plan bilateral SSM with immediate expander reconstruction. Plan vertical or anchor type scar.Given asymmetry breasts, may have different scars pattern over each breast.  Reviewed drains, OR length, hospital stay and recovery, limitations. Discussed process of expansion and implant based risks including rupture, MRI surveillance for silicone implants, infection requiring surgery or removal, contracture. Reviewed risks mastectomy flap necrosis requiring additional surgery.  Discussed use of acellular dermis in reconstruction, cadaveric source, incorporation over several weeks, risk that if has seroma or infection can act as additional nidus for infection if not incorporated.  Discussed prepectoral vs sub pectoral reconstruction. Discussed with patient and benefit of this is no animation deformity, may be less pain. Risk may be more visible rippling over upper poles, greater need of ADM. Reviewed pre pectoral would require larger amount acellular dermis, more drains. Discussed any type reconstruction also risks long term displacement implant and visible rippling. If prepectoral counseled I would recommend she be comfortable with silicone implants as more options that have less rippling. She agrees to prepectoral placement.  Reviewed reconstruction will be asensate and not stimulate. Reviewed additional risks  including but not limited to risks mastectomy flap necrosis requiring additional surgery, seroma, hematoma, asymmetry, need to additional procedures, fat necrosis, DVT/PE, damage to  adjacent structures, cardiopulmonary complications.  Post mastectomy Rx given.   Irene Limbo, MD [facility] [phone], pin [phone]

## 2018-05-07 ENCOUNTER — Other Ambulatory Visit: Payer: Self-pay

## 2018-05-07 ENCOUNTER — Encounter (HOSPITAL_BASED_OUTPATIENT_CLINIC_OR_DEPARTMENT_OTHER)
Admission: RE | Admit: 2018-05-07 | Discharge: 2018-05-07 | Disposition: A | Discharge status: Home / Self Care | Payer: BC Managed Care – PPO | Source: Ambulatory Visit | Attending: General Surgery | Admitting: General Surgery

## 2018-05-07 DIAGNOSIS — E785 Hyperlipidemia, unspecified: Secondary | ICD-10-CM | POA: Diagnosis not present

## 2018-05-07 DIAGNOSIS — E119 Type 2 diabetes mellitus without complications: Secondary | ICD-10-CM | POA: Insufficient documentation

## 2018-05-07 LAB — BASIC METABOLIC PANEL
Anion gap: 11 (ref 5–15)
BUN: 7 mg/dL (ref 6–20)
CHLORIDE: 106 mmol/L (ref 98–111)
CO2: 21 mmol/L — ABNORMAL LOW (ref 22–32)
Calcium: 8.4 mg/dL — ABNORMAL LOW (ref 8.9–10.3)
Creatinine, Ser: 0.73 mg/dL (ref 0.44–1.00)
GFR calc Af Amer: >60 mL/min (ref >60)
GFR calc non Af Amer: >60 mL/min (ref >60)
Glucose, Bld: 173 mg/dL — ABNORMAL HIGH (ref 70–99)
POTASSIUM: 3.9 mmol/L (ref 3.5–5.1)
SODIUM: 138 mmol/L (ref 135–145)

## 2018-05-07 NOTE — Progress Notes (Signed)
Ensure pre surgery drink given with instructions to complete by 0410 dos, surgical soap given with instructions, pt verbalized understanding. 

## 2018-05-13 NOTE — H&P (Signed)
12 yof I am seeing in follow up after initial visit. she has no prior history. she has family history of a couple great aunts and a maternal first cousin. she noted a left breast mass and has had left breast dc. she thinks she noted some bloody left breast dc during her mm. she underwent mm that shows b density breasts. she was noted to have a faint 7 mm group of calcs in the slightly outer right breast. there is oval mass in upper outer left breast at site of palpable concern measuring 1.4 cm. US shows an oval mass at 2 oclock 3 cm from nipple measuring 1x1.3x1.5 cm. at the subareolar left breast there is a small intraductal masx measuring 4x2x4 mm. there is no left axillary adenopathy. 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 the two clips from biopsies are 5 cm apart. since then genetic testing is negative. mri showed several areas. on the right the known area in ruoq was found this measured 1.7cm. there was also a 3.2 cm area nme that has been biopsied and is dcis now also. there are the two known areas in left. one in uoq has about 2.7cm nme. there is also 6 mm area in more loq that is discordant on mr biopsy   Past Surgical History  Breast Biopsy  Bilateral.  Diagnostic Studies History  Colonoscopy  never Mammogram  within last year Pap Smear  1-5 years ago  Allergies  Amoxicillin *PENICILLINS*  Yeast infection Allergies Reconciled   Medication History  Atorvastatin Calcium (10MG  Tablet, Oral) Active. Escitalopram Oxalate (10MG  Tablet, Oral) Active. Portia-28 (0.15-30MG -MCG Tablet, Oral) Active. Methocarbamol (500MG  Tablet, Oral) Active. MetFORMIN HCl (500MG  Tablet, Oral) Active. Multi-Vitamin (Oral) Active. Medications Reconciled  Social History  Alcohol use  Occasional alcohol use. Caffeine  use  Carbonated beverages, Tea. No drug use  Tobacco use  Never smoker.  Family History  Cancer  Mother. Diabetes Mellitus  Sister. Hypertension  Mother, Sister. Melanoma  Father. Thyroid problems  Mother.  Pregnancy / Birth History Age at menarche  75 years. Contraceptive History  Oral contraceptives. Gravida  0 Para  0  Other Problems  Anxiety Disorder  Breast Cancer  Diabetes Mellitus  Hemorrhoids    Review of Systems  General Not Present- Appetite Loss, Chills, Fatigue, Fever, Night Sweats, Weight Gain and Weight Loss. Skin Not Present- Change in Wart/Mole, Dryness, Hives, Jaundice, New Lesions, Non-Healing Wounds, Rash and Ulcer. HEENT Present- Seasonal Allergies and Wears glasses/contact lenses. Not Present- Earache, Hearing Loss, Hoarseness, Nose Bleed, Oral Ulcers, Ringing in the Ears, Sinus Pain, Sore Throat, Visual Disturbances and Yellow Eyes. Respiratory Present- Snoring. Not Present- Bloody sputum, Chronic Cough, Difficulty Breathing and Wheezing. Breast Present- Breast Mass and Nipple Discharge. Not Present- Breast Pain and Skin Changes. Cardiovascular Not Present- Chest Pain, Difficulty Breathing Lying Down, Leg Cramps, Palpitations, Rapid Heart Rate, Shortness of Breath and Swelling of Extremities. Gastrointestinal Present- Hemorrhoids. Not Present- Abdominal Pain, Bloating, Bloody Stool, Change in Bowel Habits, Chronic diarrhea, Constipation, Difficulty Swallowing, Excessive gas, Gets full quickly at meals, Indigestion, Nausea, Rectal Pain and Vomiting. Female Genitourinary Not Present- Frequency, Nocturia, Painful Urination, Pelvic Pain and Urgency. Musculoskeletal Not Present- Back Pain, Joint Pain, Joint Stiffness, Muscle Pain, Muscle Weakness and Swelling of Extremities. Neurological Not Present- Decreased Memory, Fainting, Headaches, Numbness, Seizures, Tingling, Tremor, Trouble walking  and Weakness. Psychiatric Present- Anxiety. Not Present-  Bipolar, Change in Sleep Pattern, Depression, Fearful and Frequent crying. Endocrine Not Present- Cold Intolerance, Excessive Hunger, Hair Changes, Heat Intolerance, Hot flashes and New Diabetes. Hematology Not Present- Blood Thinners, Easy Bruising, Excessive bleeding, Gland problems, HIV and Persistent Infections.  Vitals  Weight: 196.8 lb Height: 65in Body Surface Area: 1.96 m Body Mass Index: 32.75 kg/m  Temp.: 24F  Pulse: 90 (Regular)  BP: 128/88 (Sitting, Left Arm, Standard) Physical Exam General Mental Status-Alert. Head and Neck Trachea-midline. Thyroid Gland Characteristics - normal size and consistency. Eye Sclera/Conjunctiva - Bilateral-No scleral icterus. Chest and Lung Exam Chest and lung exam reveals -quiet, even and easy respiratory effort with no use of accessory muscles and on auscultation, normal breath sounds, no adventitious sounds and normal vocal resonance. Breast Nipples-No Discharge. Note: no discharge elicited today there is 1.5 cm mass in left upper outer quadrant Cardiovascular Cardiovascular examination reveals -normal heart sounds, regular rate and rhythm with no murmurs. Abdomen Note: soft no hepatomegaly Neurologic Neurologic evaluation reveals -alert and oriented x 3 with no impairment of recent or remote memory. Lymphatic Head & Neck General Head & Neck Lymphatics: Bilateral - Description - Normal. Axillary General Axillary Region: Bilateral - Description - Normal. Note: no [place] adenopathy   Bilateral DCIS Bilateral total mastectomies, bilateral axillary sn biopsy we discussed at length that she has at least two areas of dcis in either breast. her genetics are negative. I think the only real option at this point is mastectomy on both sides due to volume of disease. we discussed total mastectomy. she does desire reconstruction and will have her see plastic surgery. will then decide on nsm vs standard mastectomy. I  will do bilateral sn biopsy. we discussed options of not doing them. I think she is at risk for invasive disease in some of these and she is young so we will proceed with them. once she has seen plastic surgery will schedule.

## 2018-05-13 NOTE — Anesthesia Preprocedure Evaluation (Signed)
Anesthesia Evaluation  Patient identified by MRN, date of birth, ID band Patient awake    Reviewed: Allergy & Precautions, NPO status , Patient's Chart, lab work & pertinent test results  Airway Mallampati: II  TM Distance: >3 FB Neck ROM: Full    Dental no notable dental hx.    Pulmonary neg pulmonary ROS,    Pulmonary exam normal breath sounds clear to auscultation       Cardiovascular negative cardio ROS Normal cardiovascular exam Rhythm:Regular Rate:Normal     Neuro/Psych PSYCHIATRIC DISORDERS Anxiety negative neurological ROS     GI/Hepatic negative GI ROS, Neg liver ROS,   Endo/Other  diabetes, Type 2  Renal/GU negative Renal ROS     Musculoskeletal negative musculoskeletal ROS (+)   Abdominal (+) + obese,   Peds  Hematology negative hematology ROS (+)   Anesthesia Other Findings   Reproductive/Obstetrics negative OB ROS                             Anesthesia Physical Anesthesia Plan  ASA: II  Anesthesia Plan: General   Post-op Pain Management: GA combined w/ Regional for post-op pain   Induction: Intravenous  PONV Risk Score and Plan: 4 or greater and Ondansetron, Treatment may vary due to age or medical condition, Dexamethasone, Midazolam and Scopolamine patch - Pre-op  Airway Management Planned: Oral ETT  Additional Equipment:   Intra-op Plan:   Post-operative Plan: Extubation in OR  Informed Consent: I have reviewed the patients History and Physical, chart, labs and discussed the procedure including the risks, benefits and alternatives for the proposed anesthesia with the patient or authorized representative who has indicated his/her understanding and acceptance.   Dental advisory given  Plan Discussed with: CRNA  Anesthesia Plan Comments:         Anesthesia Quick Evaluation

## 2018-05-14 ENCOUNTER — Encounter (HOSPITAL_BASED_OUTPATIENT_CLINIC_OR_DEPARTMENT_OTHER): Payer: Self-pay

## 2018-05-14 ENCOUNTER — Ambulatory Visit (HOSPITAL_COMMUNITY)
Admission: RE | Admit: 2018-05-14 | Discharge: 2018-05-14 | Disposition: A | Discharge status: Home / Self Care | Payer: BC Managed Care – PPO | Source: Ambulatory Visit | Attending: General Surgery | Admitting: General Surgery

## 2018-05-14 ENCOUNTER — Other Ambulatory Visit: Payer: Self-pay

## 2018-05-14 ENCOUNTER — Ambulatory Visit (HOSPITAL_BASED_OUTPATIENT_CLINIC_OR_DEPARTMENT_OTHER): Payer: BC Managed Care – PPO | Admitting: Anesthesiology

## 2018-05-14 ENCOUNTER — Ambulatory Visit (HOSPITAL_BASED_OUTPATIENT_CLINIC_OR_DEPARTMENT_OTHER)
Admission: RE | Admit: 2018-05-14 | Discharge: 2018-05-15 | Disposition: A | Discharge status: Home / Self Care | Payer: BC Managed Care – PPO | Source: Ambulatory Visit | Attending: General Surgery | Admitting: General Surgery

## 2018-05-14 ENCOUNTER — Encounter (HOSPITAL_BASED_OUTPATIENT_CLINIC_OR_DEPARTMENT_OTHER)
Admission: RE | Disposition: A | Discharge status: Home / Self Care | Payer: Self-pay | Source: Ambulatory Visit | Attending: General Surgery

## 2018-05-14 DIAGNOSIS — D0511 Intraductal carcinoma in situ of right breast: Secondary | ICD-10-CM | POA: Diagnosis not present

## 2018-05-14 DIAGNOSIS — E119 Type 2 diabetes mellitus without complications: Secondary | ICD-10-CM | POA: Diagnosis not present

## 2018-05-14 DIAGNOSIS — D0512 Intraductal carcinoma in situ of left breast: Secondary | ICD-10-CM

## 2018-05-14 DIAGNOSIS — D0510 Intraductal carcinoma in situ of unspecified breast: Secondary | ICD-10-CM | POA: Diagnosis present

## 2018-05-14 DIAGNOSIS — Z803 Family history of malignant neoplasm of breast: Secondary | ICD-10-CM | POA: Diagnosis not present

## 2018-05-14 HISTORY — PX: BREAST RECONSTRUCTION WITH PLACEMENT OF TISSUE EXPANDER AND ALLODERM: SHX6805

## 2018-05-14 HISTORY — PX: MASTECTOMY W/ SENTINEL NODE BIOPSY: SHX2001

## 2018-05-14 HISTORY — DX: Anxiety disorder, unspecified: F41.9

## 2018-05-14 LAB — GLUCOSE, CAPILLARY
GLUCOSE-CAPILLARY: 147 mg/dL — AB (ref 70–99)
GLUCOSE-CAPILLARY: 206 mg/dL — AB (ref 70–99)
GLUCOSE-CAPILLARY: 222 mg/dL — AB (ref 70–99)
Glucose-Capillary: 153 mg/dL — ABNORMAL HIGH (ref 70–99)

## 2018-05-14 SURGERY — MASTECTOMY WITH SENTINEL LYMPH NODE BIOPSY
Anesthesia: General | Site: Breast | Laterality: Bilateral

## 2018-05-14 MED ORDER — MORPHINE SULFATE (PF) 2 MG/ML IV SOLN: 2.0000 mg | INTRAVENOUS | Status: DC | PRN

## 2018-05-14 MED ORDER — PHENYLEPHRINE HCL 10 MG/ML IJ SOLN
INTRAMUSCULAR | Status: DC | PRN
  Administered 2018-05-14 (×10): 80 ug via INTRAVENOUS

## 2018-05-14 MED ORDER — LIDOCAINE HCL (CARDIAC) PF 100 MG/5ML IV SOSY
PREFILLED_SYRINGE | INTRAVENOUS | Status: AC
  Filled 2018-05-14: qty 5

## 2018-05-14 MED ORDER — ROCURONIUM BROMIDE 100 MG/10ML IV SOLN
INTRAVENOUS | Status: DC | PRN
  Administered 2018-05-14: 50 mg via INTRAVENOUS
  Administered 2018-05-14: 20 mg via INTRAVENOUS

## 2018-05-14 MED ORDER — OXYCODONE HCL 5 MG PO TABS
5.0000 mg | ORAL_TABLET | Freq: Once | ORAL | Status: AC | PRN
  Administered 2018-05-14: 5 mg via ORAL
  Filled 2018-05-14: qty 1

## 2018-05-14 MED ORDER — PROMETHAZINE HCL 25 MG/ML IJ SOLN: 6.2500 mg | INTRAMUSCULAR | Status: DC | PRN

## 2018-05-14 MED ORDER — INSULIN ASPART 100 UNIT/ML ~~LOC~~ SOLN
0.0000 [IU] | Freq: Every day | SUBCUTANEOUS | Status: DC
  Administered 2018-05-14: 2 [IU] via SUBCUTANEOUS
  Filled 2018-05-14: qty 1

## 2018-05-14 MED ORDER — SIMETHICONE 80 MG PO CHEW: 40.0000 mg | CHEWABLE_TABLET | Freq: Four times a day (QID) | ORAL | Status: DC | PRN

## 2018-05-14 MED ORDER — METHOCARBAMOL 500 MG PO TABS: 500.0000 mg | ORAL_TABLET | Freq: Three times a day (TID) | ORAL | 0 refills | Status: DC | PRN

## 2018-05-14 MED ORDER — FENTANYL CITRATE (PF) 100 MCG/2ML IJ SOLN
INTRAMUSCULAR | Status: AC
  Filled 2018-05-14: qty 2

## 2018-05-14 MED ORDER — CHLORHEXIDINE GLUCONATE CLOTH 2 % EX PADS: 6.0000 | MEDICATED_PAD | Freq: Once | CUTANEOUS | Status: DC

## 2018-05-14 MED ORDER — DEXAMETHASONE SODIUM PHOSPHATE 4 MG/ML IJ SOLN
INTRAMUSCULAR | Status: DC | PRN
  Administered 2018-05-14: 10 mg via INTRAVENOUS

## 2018-05-14 MED ORDER — GLYCOPYRROLATE PF 0.2 MG/ML IJ SOSY
PREFILLED_SYRINGE | INTRAMUSCULAR | Status: AC
  Filled 2018-05-14: qty 1

## 2018-05-14 MED ORDER — SCOPOLAMINE 1 MG/3DAYS TD PT72: 1.0000 | MEDICATED_PATCH | Freq: Once | TRANSDERMAL | Status: DC | PRN

## 2018-05-14 MED ORDER — TECHNETIUM TC 99M SULFUR COLLOID FILTERED
1.0000 | Freq: Once | INTRAVENOUS | Status: AC | PRN
  Administered 2018-05-14: 1 via INTRADERMAL

## 2018-05-14 MED ORDER — ROCURONIUM BROMIDE 10 MG/ML (PF) SYRINGE
PREFILLED_SYRINGE | INTRAVENOUS | Status: AC
  Filled 2018-05-14: qty 10

## 2018-05-14 MED ORDER — CLONIDINE HCL (ANALGESIA) 100 MCG/ML EP SOLN
EPIDURAL | Status: DC | PRN
  Administered 2018-05-14: 60 ug

## 2018-05-14 MED ORDER — KETOROLAC TROMETHAMINE 15 MG/ML IJ SOLN
15.0000 mg | INTRAMUSCULAR | Status: AC
  Administered 2018-05-14: 15 mg via INTRAVENOUS

## 2018-05-14 MED ORDER — SODIUM CHLORIDE 0.9 % IV SOLN
INTRAVENOUS | Status: DC | PRN
  Administered 2018-05-14: 1000 mL

## 2018-05-14 MED ORDER — PROPOFOL 10 MG/ML IV BOLUS
INTRAVENOUS | Status: AC
  Filled 2018-05-14: qty 20

## 2018-05-14 MED ORDER — GABAPENTIN 100 MG PO CAPS
ORAL_CAPSULE | ORAL | Status: AC
  Filled 2018-05-14: qty 1

## 2018-05-14 MED ORDER — ACETAMINOPHEN 500 MG PO TABS
1000.0000 mg | ORAL_TABLET | Freq: Four times a day (QID) | ORAL | Status: DC
  Administered 2018-05-14 – 2018-05-15 (×2): 1000 mg via ORAL
  Filled 2018-05-14 (×2): qty 2

## 2018-05-14 MED ORDER — KETOROLAC TROMETHAMINE 15 MG/ML IJ SOLN
INTRAMUSCULAR | Status: AC
  Filled 2018-05-14: qty 1

## 2018-05-14 MED ORDER — SULFAMETHOXAZOLE-TRIMETHOPRIM 800-160 MG PO TABS: 1.0000 | ORAL_TABLET | Freq: Two times a day (BID) | ORAL | 0 refills | Status: DC

## 2018-05-14 MED ORDER — MIDAZOLAM HCL 2 MG/2ML IJ SOLN
INTRAMUSCULAR | Status: AC
  Filled 2018-05-14: qty 2

## 2018-05-14 MED ORDER — SUGAMMADEX SODIUM 200 MG/2ML IV SOLN
INTRAVENOUS | Status: DC | PRN
  Administered 2018-05-14: 200 mg via INTRAVENOUS

## 2018-05-14 MED ORDER — MIDAZOLAM HCL 2 MG/2ML IJ SOLN
1.0000 mg | INTRAMUSCULAR | Status: DC | PRN
  Administered 2018-05-14: 2 mg via INTRAVENOUS

## 2018-05-14 MED ORDER — FENTANYL CITRATE (PF) 100 MCG/2ML IJ SOLN
50.0000 ug | INTRAMUSCULAR | Status: DC | PRN
  Administered 2018-05-14 (×2): 50 ug via INTRAVENOUS

## 2018-05-14 MED ORDER — PHENYLEPHRINE 40 MCG/ML (10ML) SYRINGE FOR IV PUSH (FOR BLOOD PRESSURE SUPPORT)
PREFILLED_SYRINGE | INTRAVENOUS | Status: AC
  Filled 2018-05-14: qty 10

## 2018-05-14 MED ORDER — ONDANSETRON HCL 4 MG/2ML IJ SOLN
INTRAMUSCULAR | Status: DC | PRN
  Administered 2018-05-14: 4 mg via INTRAVENOUS

## 2018-05-14 MED ORDER — ONDANSETRON HCL 4 MG/2ML IJ SOLN
INTRAMUSCULAR | Status: AC
Start: 2018-05-14 — End: ?
  Filled 2018-05-14: qty 2

## 2018-05-14 MED ORDER — HYDROMORPHONE HCL 1 MG/ML IJ SOLN
INTRAMUSCULAR | Status: AC
  Filled 2018-05-14: qty 0.5

## 2018-05-14 MED ORDER — ESCITALOPRAM OXALATE 10 MG PO TABS: 10.0000 mg | ORAL_TABLET | Freq: Every day | ORAL | Status: DC

## 2018-05-14 MED ORDER — ACETAMINOPHEN 500 MG PO TABS
1000.0000 mg | ORAL_TABLET | ORAL | Status: AC
  Administered 2018-05-14: 1000 mg via ORAL

## 2018-05-14 MED ORDER — ACETAMINOPHEN 500 MG PO TABS
ORAL_TABLET | ORAL | Status: AC
  Filled 2018-05-14: qty 2

## 2018-05-14 MED ORDER — FLUTICASONE PROPIONATE 50 MCG/ACT NA SUSP: 1.0000 | Freq: Every day | NASAL | Status: DC

## 2018-05-14 MED ORDER — BUPIVACAINE-EPINEPHRINE (PF) 0.5% -1:200000 IJ SOLN
INTRAMUSCULAR | Status: DC | PRN
  Administered 2018-05-14: 40 mL

## 2018-05-14 MED ORDER — OXYCODONE HCL 5 MG PO TABS: 5.0000 mg | ORAL_TABLET | ORAL | 0 refills | Status: DC | PRN

## 2018-05-14 MED ORDER — LACTATED RINGERS IV SOLN
INTRAVENOUS | Status: DC
  Administered 2018-05-14 (×2): via INTRAVENOUS

## 2018-05-14 MED ORDER — LORATADINE 10 MG PO TABS: 10.0000 mg | ORAL_TABLET | Freq: Every day | ORAL | Status: DC

## 2018-05-14 MED ORDER — PROPOFOL 10 MG/ML IV BOLUS
INTRAVENOUS | Status: DC | PRN
  Administered 2018-05-14: 150 mg via INTRAVENOUS

## 2018-05-14 MED ORDER — ALPRAZOLAM 0.25 MG PO TABS
0.2500 mg | ORAL_TABLET | Freq: Every evening | ORAL | Status: DC | PRN
  Administered 2018-05-14: 0.25 mg via ORAL
  Filled 2018-05-14: qty 1

## 2018-05-14 MED ORDER — LIDOCAINE HCL (CARDIAC) PF 100 MG/5ML IV SOSY
PREFILLED_SYRINGE | INTRAVENOUS | Status: DC | PRN
  Administered 2018-05-14: 100 mg via INTRAVENOUS

## 2018-05-14 MED ORDER — HYDROMORPHONE HCL 1 MG/ML IJ SOLN
0.2500 mg | INTRAMUSCULAR | Status: DC | PRN
  Administered 2018-05-14: 0.5 mg via INTRAVENOUS
  Administered 2018-05-14: 0.25 mg via INTRAVENOUS

## 2018-05-14 MED ORDER — MIDAZOLAM HCL 5 MG/5ML IJ SOLN
INTRAMUSCULAR | Status: DC | PRN
  Administered 2018-05-14 (×2): 1 mg via INTRAVENOUS

## 2018-05-14 MED ORDER — GLYCOPYRROLATE 0.2 MG/ML IJ SOLN
INTRAMUSCULAR | Status: DC | PRN
  Administered 2018-05-14 (×2): 0.1 mg via INTRAVENOUS

## 2018-05-14 MED ORDER — ENSURE PRE-SURGERY PO LIQD: 592.0000 mL | Freq: Once | ORAL | Status: DC

## 2018-05-14 MED ORDER — DEXAMETHASONE SODIUM PHOSPHATE 10 MG/ML IJ SOLN
INTRAMUSCULAR | Status: AC
  Filled 2018-05-14: qty 1

## 2018-05-14 MED ORDER — OXYCODONE HCL 5 MG/5ML PO SOLN: 5.0000 mg | Freq: Once | ORAL | Status: AC | PRN

## 2018-05-14 MED ORDER — SODIUM CHLORIDE 0.9 % IJ SOLN
INTRAMUSCULAR | Status: AC
  Filled 2018-05-14: qty 10

## 2018-05-14 MED ORDER — GABAPENTIN 100 MG PO CAPS
100.0000 mg | ORAL_CAPSULE | ORAL | Status: AC
  Administered 2018-05-14: 100 mg via ORAL

## 2018-05-14 MED ORDER — SODIUM CHLORIDE 0.9 % IV SOLN
INTRAVENOUS | Status: DC
  Administered 2018-05-14: 13:00:00 via INTRAVENOUS

## 2018-05-14 MED ORDER — PHENYLEPHRINE 40 MCG/ML (10ML) SYRINGE FOR IV PUSH (FOR BLOOD PRESSURE SUPPORT)
PREFILLED_SYRINGE | INTRAVENOUS | Status: AC
  Filled 2018-05-14: qty 20

## 2018-05-14 MED ORDER — CIPROFLOXACIN IN D5W 400 MG/200ML IV SOLN
400.0000 mg | INTRAVENOUS | Status: AC
  Administered 2018-05-14 (×2): 400 mg via INTRAVENOUS

## 2018-05-14 MED ORDER — ONDANSETRON HCL 4 MG/2ML IJ SOLN: 4.0000 mg | Freq: Four times a day (QID) | INTRAMUSCULAR | Status: DC | PRN

## 2018-05-14 MED ORDER — MEPERIDINE HCL 25 MG/ML IJ SOLN: 6.2500 mg | INTRAMUSCULAR | Status: DC | PRN

## 2018-05-14 MED ORDER — TRAZODONE HCL 50 MG PO TABS
50.0000 mg | ORAL_TABLET | Freq: Every evening | ORAL | Status: DC | PRN
Start: 2018-05-14 — End: 2018-05-15

## 2018-05-14 MED ORDER — FENTANYL CITRATE (PF) 100 MCG/2ML IJ SOLN
INTRAMUSCULAR | Status: DC | PRN
  Administered 2018-05-14 (×3): 25 ug via INTRAVENOUS
  Administered 2018-05-14: 100 ug via INTRAVENOUS
  Administered 2018-05-14: 25 ug via INTRAVENOUS

## 2018-05-14 MED ORDER — KETOROLAC TROMETHAMINE 30 MG/ML IJ SOLN
30.0000 mg | Freq: Three times a day (TID) | INTRAMUSCULAR | Status: DC
  Administered 2018-05-14: 30 mg via INTRAVENOUS
  Filled 2018-05-14: qty 1

## 2018-05-14 MED ORDER — ONDANSETRON 4 MG PO TBDP: 4.0000 mg | ORAL_TABLET | Freq: Four times a day (QID) | ORAL | Status: DC | PRN

## 2018-05-14 MED ORDER — CIPROFLOXACIN IN D5W 400 MG/200ML IV SOLN
INTRAVENOUS | Status: AC
  Filled 2018-05-14: qty 200

## 2018-05-14 MED ORDER — INSULIN ASPART 100 UNIT/ML ~~LOC~~ SOLN
0.0000 [IU] | Freq: Three times a day (TID) | SUBCUTANEOUS | Status: DC
  Administered 2018-05-14: 5 [IU] via SUBCUTANEOUS
  Filled 2018-05-14: qty 1

## 2018-05-14 MED ORDER — EPHEDRINE SULFATE 50 MG/ML IJ SOLN
INTRAMUSCULAR | Status: AC
  Filled 2018-05-14: qty 1

## 2018-05-14 MED ORDER — ENSURE PRE-SURGERY PO LIQD: 296.0000 mL | Freq: Once | ORAL | Status: DC

## 2018-05-14 MED ORDER — OXYCODONE HCL 5 MG PO TABS
5.0000 mg | ORAL_TABLET | ORAL | Status: DC | PRN
  Administered 2018-05-14: 5 mg via ORAL
  Administered 2018-05-15 (×2): 10 mg via ORAL
  Filled 2018-05-14: qty 1
  Filled 2018-05-14 (×2): qty 2

## 2018-05-14 MED ORDER — METHOCARBAMOL 500 MG PO TABS
500.0000 mg | ORAL_TABLET | Freq: Two times a day (BID) | ORAL | Status: DC | PRN
Start: 2018-05-14 — End: 2018-05-15
  Administered 2018-05-14 (×2): 500 mg via ORAL
  Filled 2018-05-14 (×2): qty 1

## 2018-05-14 SURGICAL SUPPLY — 76 items
ALLODERM 16X20 PERFORATED (Tissue) ×2 IMPLANT
ALLOGRAFT PERF 16X20 1.6+/-0.4 (Tissue) ×2 IMPLANT
APPLIER CLIP 11 MED OPEN (CLIP) ×3
BAG DECANTER FOR FLEXI CONT (MISCELLANEOUS) ×3 IMPLANT
BINDER BREAST XLRG (GAUZE/BANDAGES/DRESSINGS) ×2 IMPLANT
BINDER BREAST XXLRG (GAUZE/BANDAGES/DRESSINGS) IMPLANT
BLADE SURG 10 STRL SS (BLADE) ×5 IMPLANT
BLADE SURG 15 STRL LF DISP TIS (BLADE) ×1 IMPLANT
BLADE SURG 15 STRL SS (BLADE) ×2
CANISTER SUCT 1200ML W/VALVE (MISCELLANEOUS) ×5 IMPLANT
CHLORAPREP W/TINT 26ML (MISCELLANEOUS) ×5 IMPLANT
CLIP APPLIE 11 MED OPEN (CLIP) IMPLANT
COVER BACK TABLE 60X90IN (DRAPES) ×3 IMPLANT
COVER MAYO STAND STRL (DRAPES) ×6 IMPLANT
COVER PROBE W GEL 5X96 (DRAPES) ×3 IMPLANT
DERMABOND ADVANCED (GAUZE/BANDAGES/DRESSINGS) ×6
DERMABOND ADVANCED .7 DNX12 (GAUZE/BANDAGES/DRESSINGS) ×1 IMPLANT
DRAIN CHANNEL 15F RND FF W/TCR (WOUND CARE) ×4 IMPLANT
DRAIN CHANNEL 19F RND (DRAIN) ×5 IMPLANT
DRAPE TOP ARMCOVERS (MISCELLANEOUS) ×3 IMPLANT
DRAPE U-SHAPE 76X120 STRL (DRAPES) ×3 IMPLANT
DRAPE UTILITY XL STRL (DRAPES) ×3 IMPLANT
DRSG PAD ABDOMINAL 8X10 ST (GAUZE/BANDAGES/DRESSINGS) ×10 IMPLANT
DRSG TEGADERM 4X10 (GAUZE/BANDAGES/DRESSINGS) ×6 IMPLANT
DRSG TEGADERM 4X4.75 (GAUZE/BANDAGES/DRESSINGS) IMPLANT
ELECT BLADE 4.0 EZ CLEAN MEGAD (MISCELLANEOUS) ×3
ELECT COATED BLADE 2.86 ST (ELECTRODE) ×3 IMPLANT
ELECT REM PT RETURN 9FT ADLT (ELECTROSURGICAL) ×3
ELECTRODE BLDE 4.0 EZ CLN MEGD (MISCELLANEOUS) ×1 IMPLANT
ELECTRODE REM PT RTRN 9FT ADLT (ELECTROSURGICAL) ×1 IMPLANT
EVACUATOR SILICONE 100CC (DRAIN) ×9 IMPLANT
EXPANDER BREAST TISSUE 400CC (Breast) ×4 IMPLANT
GLOVE BIO SURGEON STRL SZ 6 (GLOVE) ×8 IMPLANT
GLOVE BIO SURGEON STRL SZ 6.5 (GLOVE) ×1 IMPLANT
GLOVE BIO SURGEON STRL SZ7 (GLOVE) ×5 IMPLANT
GLOVE BIO SURGEONS STRL SZ 6.5 (GLOVE) ×1
GLOVE BIOGEL PI IND STRL 7.0 (GLOVE) IMPLANT
GLOVE BIOGEL PI IND STRL 7.5 (GLOVE) ×1 IMPLANT
GLOVE BIOGEL PI INDICATOR 7.0 (GLOVE) ×4
GLOVE BIOGEL PI INDICATOR 7.5 (GLOVE) ×2
GOWN STRL REUS W/ TWL LRG LVL3 (GOWN DISPOSABLE) ×4 IMPLANT
GOWN STRL REUS W/TWL LRG LVL3 (GOWN DISPOSABLE) ×8
KIT FILL SYSTEM UNIVERSAL (SET/KITS/TRAYS/PACK) ×2 IMPLANT
LIGHT WAVEGUIDE WIDE FLAT (MISCELLANEOUS) ×3 IMPLANT
NS IRRIG 1000ML POUR BTL (IV SOLUTION) ×3 IMPLANT
PACK BASIN DAY SURGERY FS (CUSTOM PROCEDURE TRAY) ×3 IMPLANT
PENCIL BUTTON HOLSTER BLD 10FT (ELECTRODE) ×3 IMPLANT
PIN SAFETY STERILE (MISCELLANEOUS) ×5 IMPLANT
SHEET MEDIUM DRAPE 40X70 STRL (DRAPES) ×5 IMPLANT
SLEEVE SCD COMPRESS KNEE MED (MISCELLANEOUS) ×3 IMPLANT
SPONGE LAP 18X18 RF (DISPOSABLE) ×8 IMPLANT
STAPLER VISISTAT 35W (STAPLE) ×3 IMPLANT
SUT CHROMIC 4 0 PS 2 18 (SUTURE) ×12 IMPLANT
SUT ETHILON 2 0 FS 18 (SUTURE) ×5 IMPLANT
SUT MNCRL AB 4-0 PS2 18 (SUTURE) ×7 IMPLANT
SUT SILK 2 0 SH (SUTURE) ×2 IMPLANT
SUT VIC AB 2-0 SH 27 (SUTURE) ×2
SUT VIC AB 2-0 SH 27XBRD (SUTURE) IMPLANT
SUT VIC AB 3-0 54X BRD REEL (SUTURE) IMPLANT
SUT VIC AB 3-0 BRD 54 (SUTURE)
SUT VIC AB 3-0 SH 27 (SUTURE) ×8
SUT VIC AB 3-0 SH 27X BRD (SUTURE) IMPLANT
SUT VICRYL 0 CT-2 (SUTURE) ×8 IMPLANT
SUT VICRYL 3-0 CR8 SH (SUTURE) ×3 IMPLANT
SUT VICRYL 4-0 PS2 18IN ABS (SUTURE) ×4 IMPLANT
SUT VLOC 180 0 24IN GS25 (SUTURE) ×4 IMPLANT
SYR 50ML LL SCALE MARK (SYRINGE) ×3 IMPLANT
SYR BULB IRRIGATION 50ML (SYRINGE) ×3 IMPLANT
TOWEL GREEN STERILE FF (TOWEL DISPOSABLE) ×6 IMPLANT
TOWEL OR NON WOVEN STRL DISP B (DISPOSABLE) ×3 IMPLANT
TRAY DSU PREP LF (CUSTOM PROCEDURE TRAY) ×3 IMPLANT
TRAY FOLEY W/BAG SLVR 14FR (SET/KITS/TRAYS/PACK) ×2 IMPLANT
TUBE CONNECTING 20'X1/4 (TUBING) ×2
TUBE CONNECTING 20X1/4 (TUBING) ×3 IMPLANT
UNDERPAD 30X30 (UNDERPADS AND DIAPERS) ×6 IMPLANT
YANKAUER SUCT BULB TIP NO VENT (SUCTIONS) ×5 IMPLANT

## 2018-05-14 NOTE — Op Note (Signed)
Preoperative diagnosis: bilateral DCIS Postoperative diagnosis: same as above Procedure: bilateral total mastectomies, bilateral deep axillary sentinel node biopsies Surgeon: Dr Serita Grammes Asst: Dr Irene Limbo Estimated blood loss: 25 cc Specimens: 1.  Right total mastectomy.  Long stitch lateral, still short stitch superior 2.  Left total mastectomy, long stitch lateral, short stitch superior 3. Right axillary sentinel nodes 4. Left axillary sentinel nodes Complications: None Drains: Per plastic surgery Anesthesia: General with bilateral pectoral blocks Special count was correct Disposition Case turned over to plastic surgery for reconstruction  Indications: This is a 30 yof with bilateral dcis and nipple discharge on one side. We discussed options and have elected to proceed with ssm and bilateral axillary sentinel nodes. This will be followed by prepectoral expander reconstruction.  Procedure: After informed consent was obtained the patient first underwent bilateral pectoral blocks.  She was given antibiotics.  She had SCDs in place.  She was then placed under general anesthesia without complication.  She was prepped and draped in the standard sterile surgical fashion.  A surgical timeout was then performed.   I performed the left mastectomy first.  I used the triangular incision around the nipple areola that Dr. Iran Planas had marked.  I then created flaps to the parasternal region, clavicle, latissimus, and inframammary fold.  The breast tissue and the pectoralis fascia were then removed from the pectoralis muscle.  This was marked as above.  Hemostasis was obtained.  I then opened the axilla.  I used the neoprobe to identify what appeared to be several sentinel nodes. These were removed and passed off the table. There was no background radioactivity. I packed the sponge in this cavity and turned my attention to the left side.  I then made a similar incision on the right side  and created flaps in a similar fashion.  The breast and the fascia were then removed from the pectoralis muscle and passed off the table.  This was marked as above.  Hemostasis was obtained.  I then opened the axilla.  I used the neoprobe to identify what appeared to be several sentinel nodes. These were removed and passed off the table. There was no background radioactivity.  This was packed in the case turned over to plastic surgery for reconstruction.

## 2018-05-14 NOTE — Progress Notes (Signed)
Assisted Dr. Lissa Hoard with right, left, ultrasound guided, pectoralis block and nuc med tech with bil nuc med inj. Side rails up, monitors on throughout procedure. See vital signs in flow sheet. Tolerated Procedure well.

## 2018-05-14 NOTE — Interval H&P Note (Signed)
History and Physical Interval Note:  05/14/2018 6:44 AM  Maureen Mahoney  has presented today for surgery, with the diagnosis of BILATERAL BREAST CANCER  The various methods of treatment have been discussed with the patient and family. After consideration of risks, benefits and other options for treatment, the patient has consented to  Procedure(s): BILATERAL TOTAL MASTECTOMIES WITH BILATERAL SENTINEL LYMPH NODE BIOPSIES (Bilateral) BREAST RECONSTRUCTION WITH PLACEMENT OF TISSUE EXPANDER AND ALLODERM (Right) as a surgical intervention .  The patient's history has been reviewed, patient examined, no change in status, stable for surgery.  I have reviewed the patient's chart and labs.  Questions were answered to the patient's satisfaction.     Maureen Mahoney

## 2018-05-14 NOTE — Anesthesia Procedure Notes (Signed)
Anesthesia Regional Block: Pectoralis block   Pre-Anesthetic Checklist: ,, timeout performed, Correct Patient, Correct Site, Correct Laterality, Correct Procedure, Correct Position, site marked, Risks and benefits discussed,  Surgical consent,  Pre-op evaluation,  At surgeon's request and post-op pain management  Laterality: Left and Right  Prep: chloraprep       Needles:   Needle Type: Stimiplex     Needle Length: 9cm      Additional Needles:   Procedures:,,,, ultrasound used (permanent image in chart),,,,  Narrative:  Start time: 05/14/2018 7:20 AM End time: 05/14/2018 7:27 AM Injection made incrementally with aspirations every 5 mL.  Performed by: Personally  Anesthesiologist: Nolon Nations, MD  Additional Notes: Patient tolerated well. Good fascial spread noted.

## 2018-05-14 NOTE — Transfer of Care (Signed)
Immediate Anesthesia Transfer of Care Note  Patient: Maureen Mahoney  Procedure(s) Performed: BILATERAL TOTAL MASTECTOMIES WITH BILATERAL SENTINEL LYMPH NODE BIOPSIES (Bilateral Breast) BILATERAL BREAST RECONSTRUCTION WITH PLACEMENT OF TISSUE EXPANDER AND ACELLULAR DERMIS (Bilateral Breast)  Patient Location: PACU  Anesthesia Type:GA combined with regional for post-op pain  Level of Consciousness: awake  Airway & Oxygen Therapy: Patient Spontanous Breathing and Patient connected to face mask oxygen  Post-op Assessment: Report given to RN and Post -op Vital signs reviewed and stable  Post vital signs: Reviewed and stable  Last Vitals:  Vitals Value Taken Time  BP    Temp    Pulse    Resp    SpO2      Last Pain:  Vitals:   05/14/18 0641  TempSrc: Oral         Complications: No apparent anesthesia complications

## 2018-05-14 NOTE — Interval H&P Note (Signed)
History and Physical Interval Note:  05/14/2018 7:20 AM  Maureen Mahoney  has presented today for surgery, with the diagnosis of BILATERAL BREAST CANCER  The various methods of treatment have been discussed with the patient and family. After consideration of risks, benefits and other options for treatment, the patient has consented to  Procedure(s): BILATERAL TOTAL MASTECTOMIES WITH BILATERAL SENTINEL LYMPH NODE BIOPSIES (Bilateral) BREAST RECONSTRUCTION WITH PLACEMENT OF TISSUE EXPANDER AND ALLODERM (Right) as a surgical intervention .  The patient's history has been reviewed, patient examined, no change in status, stable for surgery.  I have reviewed the patient's chart and labs.  Questions were answered to the patient's satisfaction.     Rolm Bookbinder

## 2018-05-14 NOTE — Op Note (Signed)
Operative Note   DATE OF OPERATION: 7.2.19  LOCATION: [facility]-observation  SURGICAL DIVISION: Plastic Surgery  PREOPERATIVE DIAGNOSES:  Bilateral DCIS breast  POSTOPERATIVE DIAGNOSES:  same  PROCEDURE:  1. Bilateral breast reconstruction with tissue expanders 2. Acellular dermis for breast reconstruction 600 cm2  SURGEON: Irene Limbo MD MBA  ASSISTANT: none  ANESTHESIA:  General.   EBL: 75 ml for entire procedure  COMPLICATIONS: None immediate.   INDICATIONS FOR PROCEDURE:  The patient, Maureen Mahoney, is a 52 y.o. female born on January 15, 1966, is here for immediate prepectoral expander acellular reconstruction following skin reduction pattern mastectomies.   FINDINGS: Natrelle 133FV-12-T 400 ml tissue expanders placed bilateral, initial fill volume 290 ml air bilateral. RIGHT SN [id number] LEFT SN [id number]  DESCRIPTION OF PROCEDURE:  The patient was marked with the patient in the preoperative area to mark sternal notch, chest midline, anterior axillary lines and inframammary folds. Patient was marked for skin reduction mastectomy with most superior portion nipple areola marked on breast meridian. Vertical limbs marked by breast displacement and set at8.5cm length. The patient was taken to the operating room. SCDs were placed and IV antibiotics were given. Foley catheter placed. The patient's operative site was prepped and draped in a sterile fashion. A time out was performed and all information was confirmed to be correct. In supine position, the lateral limbs for resection marked and area over lower pole preserved as inferiorly based dermal pedicle. Skin de epithelialized in this area.I assisted in mastectomiesand sentinelnode dissectionwith retraction and exposure.Following completion of mastectomies, reconstruction began onleftside.  The cavity was irrigated with solution containingAncef,bacitracin, and gentamicin. Hemostasis was ensured. A 19 Fr drain was  placed in subcutaneous position laterally anda 15 Fr drain placed along inframammary fold. Eachsecured to skin with 2-0 nylon. Cavity irrigated with Betadine.The tissue expanderswere prepared on back table prior in insertion. The expander was filled with air to282ml.Perforated acellular dermis was draped over anterior surface expander. The ADM was then secured to itself over posterior surface of expander. Redundant folds acellular dermis excised so that the ADM lied flat without folds over air filled expander.The expander was secured to medial insertion pectoralis with a 0 vicryl.The superior and lateral tabs also secured to pectoralis muscle with 0-vicryl. The ADM was secured to pectoralis muscle and chest wall along inferior border at inframammary fold.Laterally the mastectomy flap over posterior axillary line was advanced anteriorly and the subcutaneous tissue and superficial fascia was secured to pectoralis muscle and acellular dermis with 0-vicryl. The inferiorly based dermal pedicle was redraped superiorly over expander and acellular dermis and secured to pectoralis with interrupted 0-vicryl. Skin closure completedwith 3-0 vicryl in fascial layer and 4-0 vicryl in dermis. Skin closure completed with 4-0 monocryl subcuticular and tissue adhesive.  I then directed my attention torightchest where similar irrigation and drain placement completed. The prepared expander with ADM secured over anterior surface was placed in right chest and tabs secured to chest wall and pectoralis muscle with 0- vicryl suture. The acellular dermis at inframammary fold was secured to chest wall with 0 V-lock suture.Laterally the mastectomy flap over posterior axillary line was advanced anteriorly and the subcutaneous tissue and superficial fascia was secured to pectoralis muscle and acellular dermis with 0-vicryl. The inferiorly based dermal pedicle was redraped superiorly over expander and acellular dermis and  secured to pectoralis with interrupted 0-vicryl. Skin closure completedwith 3-0 vicryl in fascial layer and 4-0 vicryl in dermis. Skin closure completed with 4-0 monocryl subcuticular and tissue adhesive.Tegaderms applied bilateral, followed  by dry dressing and breast binder.  The patient was allowed to wake from anesthesia, extubated and taken to the recovery room in satisfactory condition.   SPECIMENS: none  DRAINS: 15 and 19 Fr JP in right and left breast reconstruction  Irene Limbo, MD [facility] Plastic & Reconstructive Surgery [phone], pin [phone]

## 2018-05-14 NOTE — Anesthesia Procedure Notes (Signed)
Procedure Name: Intubation Date/Time: 05/14/2018 7:49 AM Performed by: Marrianne Mood, CRNA Pre-anesthesia Checklist: Patient identified, Emergency Drugs available, Suction available, Patient being monitored and Timeout performed Patient Re-evaluated:Patient Re-evaluated prior to induction Oxygen Delivery Method: Circle system utilized Preoxygenation: Pre-oxygenation with 100% oxygen Induction Type: IV induction Ventilation: Mask ventilation without difficulty Laryngoscope Size: Mac, 3 and Glidescope Grade View: Grade III Tube type: Oral Number of attempts: 2 Airway Equipment and Method: Stylet and Oral airway Placement Confirmation: ETT inserted through vocal cords under direct vision,  positive ETCO2 and breath sounds checked- equal and bilateral Secured at: 22 cm Tube secured with: Tape Dental Injury: Teeth and Oropharynx as per pre-operative assessment  Difficulty Due To: Difficulty was unanticipated, Difficult Airway- due to large tongue and Difficult Airway- due to reduced neck mobility

## 2018-05-15 DIAGNOSIS — D0512 Intraductal carcinoma in situ of left breast: Secondary | ICD-10-CM | POA: Diagnosis not present

## 2018-05-15 NOTE — Discharge Summary (Signed)
Admit date: 05/14/2018  Discharge date: 05/15/2018  Admission Diagnoses: DCIS breast bilateral  Discharge Diagnoses:  same  Discharged Condition: stable  Hospital Course:  Postoperatively patient did well with minimal pain, tolerating oral medication and diet.  Treatments: surgery: bilateral mastectomies with immediate expander acellular dermis reconstruction, bilateral SLN  Discharge Exam:  Incision/Wound: incisions intact chest soft Tegaderms in place, drains serosanguinous  Disposition:   Home  Irene Limbo, MD [facility] Plastic & Reconstructive Surgery [phone], pin [phone]

## 2018-05-15 NOTE — Progress Notes (Signed)
Patient ID: Maureen Mahoney, female   DOB: [birth year], 52 y.o.   MRN: [id number] Doing fine, drains as expected no hematoma, dc today, path pending

## 2018-05-17 ENCOUNTER — Encounter (HOSPITAL_BASED_OUTPATIENT_CLINIC_OR_DEPARTMENT_OTHER): Payer: Self-pay | Admitting: General Surgery

## 2018-05-17 NOTE — Anesthesia Postprocedure Evaluation (Signed)
Anesthesia Post Note  Patient: Maureen Mahoney  Procedure(s) Performed: BILATERAL TOTAL MASTECTOMIES WITH BILATERAL SENTINEL LYMPH NODE BIOPSIES (Bilateral Breast) BILATERAL BREAST RECONSTRUCTION WITH PLACEMENT OF TISSUE EXPANDER AND ACELLULAR DERMIS (Bilateral Breast)     Patient location during evaluation: PACU Anesthesia Type: General Level of consciousness: sedated and patient cooperative Pain management: pain level controlled Vital Signs Assessment: post-procedure vital signs reviewed and stable Respiratory status: spontaneous breathing Cardiovascular status: stable Anesthetic complications: no    Last Vitals:  Vitals:   05/15/18 0430 05/15/18 0615  BP: (!) 163/73   Pulse: 73   Resp: 16   Temp:  36.7 C  SpO2: 97%     Last Pain:  Vitals:   05/15/18 0840  TempSrc:   PainSc: [place]

## 2018-05-21 ENCOUNTER — Encounter: Payer: Self-pay | Admitting: *Deleted

## 2018-05-21 ENCOUNTER — Telehealth: Payer: Self-pay | Admitting: *Deleted

## 2018-05-21 DIAGNOSIS — Z170 Estrogen receptor positive status [ER+]: Secondary | ICD-10-CM

## 2018-05-21 DIAGNOSIS — C50412 Malignant neoplasm of upper-outer quadrant of left female breast: Secondary | ICD-10-CM | POA: Insufficient documentation

## 2018-05-21 NOTE — Telephone Encounter (Signed)
Ordered oncotype per Dr. Donne Hazel.  Faxed requisition to pathology and confirmed receipt.

## 2018-05-22 ENCOUNTER — Telehealth: Payer: Self-pay | Admitting: Hematology and Oncology

## 2018-05-22 ENCOUNTER — Encounter: Payer: Self-pay | Admitting: Hematology and Oncology

## 2018-05-22 NOTE — Telephone Encounter (Signed)
New referral received from Dr. Donne Hazel at [facility]. I received a message from Iris Pert, Breast RN Naviagtor, to schedule a med onc appointment. Appt has been scheduled to see Dr. Lindi Adie on 7/30 at 345pm. I left the appt date and time on the patient's voicemail. Letter mailed with the appointment information.

## 2018-06-03 ENCOUNTER — Telehealth: Payer: Self-pay | Admitting: *Deleted

## 2018-06-03 NOTE — Telephone Encounter (Signed)
Received Oncotype score of 0/3%. Physician team notified. Called pt with results. Discussed no chemo recommended. Confirmed new pt appt with Dr. Lindi Adie on 7/30. Denies further needs or questions at this time.

## 2018-06-05 ENCOUNTER — Encounter (HOSPITAL_COMMUNITY): Payer: Self-pay | Admitting: General Surgery

## 2018-06-10 NOTE — H&P (Signed)
  Subjective:     Patient ID: Maureen Mahoney is a 52 y.o. female.  HPI  Nearly  weeks post op. Saline exchange completed last week, noted decrease size on left over first day and now reports flat on left.  Presented with palpable left breast mass. MMG/US with 0.4 cm RETROAREOLAR LEFT breast mass and  1.5 cm UPPER-OUTER LEFT breast mass. The 2 clips are separated by a distance of 4 cm. Over right breast UOQ calcifications noted. 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  Biopsy of RIGHT breast UO with DCIS. All ER/PR+. She reports left nipple discharge, this was present with MMG but has continued.  MRI showed biopsy-proven RIGHT breast UOQ DCIS corresponds to 1.7 cm linear stippled enhancement. RIGHT LIQ NME present.  Over LEFT breast, biopsy-proven left breast 2 o'clock DCIS presents as 1.6 cm enhancing mass, with surrounding NEM measuring 2.7 Cm. Biopsy-proven left subareolar breast DCIS presents as 3 mmprogressively enhancing nodule. 6 mm enhancing nodule in the left 5:30 o'clock breast noted. No evidence of axillary lymphadenopathy.  Additional MR biopsies RIGHT breast LIQ with intermediate grde DCIS, ER/PR+. LEFT breast 530 biopsy benign- may be discordant due to patient movement.  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   Genetics negative. Oncotype pending. Has appt Dr. Lindi Adie 7.30.19  Prior "barely C," desires full C. Right mastectomy 459 g Left 476 g  PMH includes DM, last HbA1c reports between 6-7.   Sister is Therapist, sports and instructs at [facility]. 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   Review of Systems     Objective:   Physical Exam  Constitutional: She is oriented to person, place, and time.   Cardiovascular: Normal rate, regular rhythm and normal heart sounds.   Pulmonary/Chest: Effort normal and breath sounds normal.  Neurological: She is alert and oriented to person, place, and time.   Chest:  left side volume diminished from last exam, 1-2 mm open area at T junction Right soft benign    Assessment:     IDC RIGHT breast, DCIS left breast Family history breast cancer S/p bilateral SRM with prepectoral TE/ADM ([employer]oderm) reconstruction Rupture left TE    Plan:     Pictures today prior to fill. Replaced volume on left to hold expansion, will plan replacement TE this week. Reviewed OP surgery, possible drain, utilized IMF portion scar. In absence of additional complications, do not anticipate that this will significantly delay time to next surgery. Additional 200 ml fluid placed on left. Has Oncology follow up tomorrow.   Natrelle 133FV-12-T 400 ml tissue expanders placed bilateral,   fill volume 360 ml saline bilateral    Irene Limbo, MD [facility] Plastic & Reconstructive Surgery [phone], pin [phone]

## 2018-06-11 ENCOUNTER — Telehealth: Payer: Self-pay | Admitting: Hematology and Oncology

## 2018-06-11 ENCOUNTER — Inpatient Hospital Stay: Payer: BC Managed Care – PPO | Attending: Genetic Counselor | Admitting: Hematology and Oncology

## 2018-06-11 ENCOUNTER — Encounter (HOSPITAL_BASED_OUTPATIENT_CLINIC_OR_DEPARTMENT_OTHER): Payer: Self-pay | Admitting: *Deleted

## 2018-06-11 ENCOUNTER — Other Ambulatory Visit: Payer: Self-pay

## 2018-06-11 VITALS — BP 141/82 | HR 72 | Temp 98.0°F | Resp 18 | Ht 65.0 in | Wt 196.4 lb

## 2018-06-11 DIAGNOSIS — Z7982 Long term (current) use of aspirin: Secondary | ICD-10-CM | POA: Insufficient documentation

## 2018-06-11 DIAGNOSIS — Z79810 Long term (current) use of selective estrogen receptor modulators (SERMs): Secondary | ICD-10-CM | POA: Diagnosis not present

## 2018-06-11 DIAGNOSIS — Z79899 Other long term (current) drug therapy: Secondary | ICD-10-CM

## 2018-06-11 DIAGNOSIS — Z801 Family history of malignant neoplasm of trachea, bronchus and lung: Secondary | ICD-10-CM | POA: Diagnosis not present

## 2018-06-11 DIAGNOSIS — C50412 Malignant neoplasm of upper-outer quadrant of left female breast: Secondary | ICD-10-CM

## 2018-06-11 DIAGNOSIS — Z7984 Long term (current) use of oral hypoglycemic drugs: Secondary | ICD-10-CM

## 2018-06-11 DIAGNOSIS — Z9013 Acquired absence of bilateral breasts and nipples: Secondary | ICD-10-CM | POA: Diagnosis not present

## 2018-06-11 DIAGNOSIS — Z803 Family history of malignant neoplasm of breast: Secondary | ICD-10-CM

## 2018-06-11 DIAGNOSIS — D0512 Intraductal carcinoma in situ of left breast: Secondary | ICD-10-CM | POA: Diagnosis not present

## 2018-06-11 DIAGNOSIS — Z170 Estrogen receptor positive status [ER+]: Secondary | ICD-10-CM | POA: Insufficient documentation

## 2018-06-11 DIAGNOSIS — D0511 Intraductal carcinoma in situ of right breast: Secondary | ICD-10-CM | POA: Diagnosis not present

## 2018-06-11 DIAGNOSIS — E119 Type 2 diabetes mellitus without complications: Secondary | ICD-10-CM | POA: Diagnosis not present

## 2018-06-11 MED ORDER — TAMOXIFEN CITRATE 20 MG PO TABS: 20.0000 mg | ORAL_TABLET | Freq: Every day | ORAL | 3 refills | Status: DC

## 2018-06-11 NOTE — Telephone Encounter (Signed)
Gave patient avs.  Patient did not need calendar.  °

## 2018-06-11 NOTE — Assessment & Plan Note (Signed)
05/14/2018: Bilateral mastectomies:  Left mastectomy: IDC with papillary features grade 2, 1.7 cm, intermediate grade DCIS, LCIS, margins negative, 0/2 lymph nodes negative, ER 100%, PR 100%, Ki-67 10%, HER-2 negative ratio 1.21, T1CN0 stage Ia; Right mastectomy: DCIS intermediate grade 1.2 cm, margins negative, 0/3 lymph nodes negative, ER 100%, PR 80%, Tis NX stage 0  Pathology counseling: I discussed the final pathology report of the patient provided  a copy of this report. I discussed the margins as well as lymph node surgeries. We also discussed the final staging along with previously performed ER/PR and HER-2/neu testing.  Recommendation: 1. Oncotype DX testing on the left breast 2. Followed by adjuvant antiestrogen therapy with letrozole 2.5 mg daily x 5-7 years  Return to clinic Based upon Oncotype test results

## 2018-06-11 NOTE — Progress Notes (Signed)
[facility] CONSULT NOTE  Patient Care Team: Asencion Noble, MD as PCP - General (Internal Medicine)  CHIEF COMPLAINTS/PURPOSE OF CONSULTATION:  Newly diagnosed breast cancer  HISTORY OF PRESENTING ILLNESS:  Maureen Mahoney 52 y.o. female is here because of recent diagnosis of bilateral breast cancers.  Patient felt a lump in the left breast which led to mammograms and ultrasound biopsy and breast MRI.  She was then found to have a right breast DCIS as well.  She was seen by Dr. Donne Hazel who recommended bilateral mastectomies.  This was performed on 05/14/2018.  She is recovering very well from the recent surgery and she is undergoing breast reconstruction.  She was sent to [place] for discussion regarding adjuvant treatment options.  I reviewed her records extensively and collaborated the history with the patient.  SUMMARY OF ONCOLOGIC HISTORY:   Malignant neoplasm of upper-outer quadrant of left breast in female, estrogen receptor positive ([place])   05/14/2018 Surgery    Bilateral mastectomies: Left mastectomy: IDC with papillary features grade 2, 1.7 cm, intermediate grade DCIS, LCIS, margins negative, 0/2 lymph nodes negative, ER 100%, PR 100%, Ki-67 10%, HER-2 negative ratio 1.21, T1CN0 stage Ia; Right mastectomy: DCIS intermediate grade 1.2 cm, margins negative, 0/3 lymph nodes negative, ER 100%, PR 80%, Tis NX stage 0      05/31/2018 Oncotype testing    Oncotype recurrence score: 0, 3% risk of distant recurrence of 9 years      MEDICAL HISTORY:  Past Medical History:  Diagnosis Date  . Anxiety   . Diabetes mellitus without complication ([place])   . 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 Family history of breast cancer   . Family history of lung cancer   . Family history of melanoma   . Irritable bowel disease     SURGICAL HISTORY: Past Surgical History:  Procedure Laterality Date  .  BREAST RECONSTRUCTION WITH PLACEMENT OF TISSUE EXPANDER AND ALLODERM Bilateral 05/14/2018   Procedure: BILATERAL BREAST RECONSTRUCTION WITH PLACEMENT OF TISSUE EXPANDER AND ACELLULAR DERMIS;  Surgeon: Irene Limbo, MD;  Location: [facility];  Service: Plastics;  Laterality: Bilateral;  . MASTECTOMY W/ SENTINEL NODE BIOPSY Bilateral 05/14/2018   Procedure: BILATERAL TOTAL MASTECTOMIES WITH BILATERAL SENTINEL LYMPH NODE BIOPSIES;  Surgeon: Rolm Bookbinder, MD;  Location: [facility];  Service: General;  Laterality: Bilateral;    SOCIAL HISTORY: Social History   Socioeconomic History  . Marital status: Divorced    Spouse name: Not on file  . Number of children: Not on file  . Years of education: Not on file  . Highest education level: Not on file  Occupational History  . Not on file  Social Needs  . Financial resource strain: Not on file  . Food insecurity:    Worry: Not on file    Inability: Not on file  . Transportation needs:    Medical: Not on file    Non-medical: Not on file  Tobacco Use  . Smoking status: Never Smoker  . Smokeless tobacco: Never Used  Substance and Sexual Activity  . Alcohol use: Yes    Comment: occass  . Drug use: Never  . Sexual activity: Not Currently    Birth control/protection: Pill  Lifestyle  . Physical activity:    Days per week: Not on file    Minutes per session: Not on file  . Stress: Not on file  Relationships  . Social connections:    Talks on phone: Not on file    Gets together: Not on file    Attends religious service: Not on file    Active member of club or organization: Not on file    Attends meetings of clubs or organizations: Not on file    Relationship status: Not on file  . Intimate partner violence:    Fear of current or ex partner: Not on file    Emotionally abused: Not on file    Physically abused: Not on file    Forced sexual activity: Not on file  Other Topics Concern  . Not on file   Social History Narrative  . Not on file    FAMILY HISTORY: Family History  Problem Relation Age of Onset  . Lung cancer Mother 38  . Heart disease Maternal Uncle   . Heart disease Paternal Aunt   . Other Maternal Grandmother        pituitary tumor, not cancer  . Parkinson's disease Maternal Grandfather   . Breast cancer Other 80  . Breast cancer Other 50  . Breast cancer Other 80  . Lymphoma Other     ALLERGIES:  is allergic to penicillins.  MEDICATIONS:  Current Outpatient Medications  Medication Sig Dispense Refill  . ALPRAZolam (XANAX) 0.25 MG tablet Take 0.25 mg by mouth at bedtime as needed for anxiety.    Marland Kitchen aspirin 81 MG chewable tablet Chew by mouth daily.    Marland Kitchen atorvastatin (LIPITOR) 10 MG tablet Take 10 mg by mouth daily.    . cetirizine (ZYRTEC) 5 MG tablet Take 5 mg by mouth daily.    Marland Kitchen escitalopram (LEXAPRO) 10 MG tablet Take 10 mg by mouth daily.    . fluticasone (FLONASE) 50 MCG/ACT nasal spray Place into both nostrils daily.    . metFORMIN (GLUCOPHAGE) 500 MG tablet Take 500 mg by mouth 2 (two) times daily with a meal.     . methocarbamol (ROBAXIN) 500 MG tablet Take 1 tablet (500 mg total) by mouth every 8 (eight) hours as needed for muscle spasms. 30 tablet 0  . Multiple Vitamins-Minerals (MULTIVITAMIN WITH MINERALS) tablet Take 1 tablet by mouth daily.    Marland Kitchen oxyCODONE (ROXICODONE) 5 MG immediate release tablet Take 1-2 tablets (5-10 mg total) by mouth every 4 (four) hours as needed. 40 tablet 0  . polyethylene glycol powder (MIRALAX) powder Take 1 Container by mouth once.    . tamoxifen (NOLVADEX) 20 MG tablet Take 1 tablet (20 mg total) by mouth daily. 90 tablet 3   No current facility-administered medications for this visit.     REVIEW OF SYSTEMS:   Constitutional: Denies fevers, chills or abnormal night sweats Eyes: Denies blurriness of vision, double vision or watery eyes Ears, nose, mouth, throat, and face: Denies mucositis or sore  throat Respiratory: Denies cough, dyspnea or wheezes Cardiovascular: Denies palpitation, chest discomfort or lower extremity swelling Gastrointestinal:  Denies nausea, heartburn or change in bowel habits Skin: Denies abnormal skin rashes Lymphatics: Denies new lymphadenopathy or easy bruising Neurological:Denies numbness, tingling or new weaknesses Behavioral/Psych: Mood is stable, no new changes  Breast: Bilateral mastectomies with reconstruction All other systems were reviewed with the patient and are negative.  PHYSICAL EXAMINATION: ECOG PERFORMANCE STATUS: 1 - Symptomatic but completely ambulatory  Vitals:   06/11/18 1543  BP: (!) 141/82  Pulse: 72  Resp: 18  Temp: 98 F (36.7 C)  SpO2: 99%   Filed Weights   06/11/18  1543  Weight: 196 lb 6.4 oz (89.1 kg)    GENERAL:alert, no distress and comfortable SKIN: skin color, texture, turgor are normal, no rashes or significant lesions EYES: normal, conjunctiva are pink and non-injected, sclera clear OROPHARYNX:no exudate, no erythema and lips, buccal mucosa, and tongue normal  NECK: supple, thyroid normal size, non-tender, without nodularity LYMPH:  no palpable lymphadenopathy in the cervical, axillary or inguinal LUNGS: clear to auscultation and percussion with normal breathing effort HEART: regular rate & rhythm and no murmurs and no lower extremity edema ABDOMEN:abdomen soft, non-tender and normal bowel sounds Musculoskeletal:no cyanosis of digits and no clubbing  PSYCH: alert & oriented x 3 with fluent speech NEURO: no focal motor/sensory deficits  LABORATORY DATA:  I have reviewed the data as listed No results found for: WBC, HGB, HCT, MCV, PLT Lab Results  Component Value Date   NA 138 05/07/2018   K 3.9 05/07/2018   CL 106 05/07/2018   CO2 21 (L) 05/07/2018    RADIOGRAPHIC STUDIES: I have personally reviewed the radiological reports and agreed with the findings in the report.  ASSESSMENT AND PLAN:  Malignant  neoplasm of upper-outer quadrant of left breast in female, estrogen receptor positive ([place]) 05/14/2018: Bilateral mastectomies:  Left mastectomy: IDC with papillary features grade 2, 1.7 cm, intermediate grade DCIS, LCIS, margins negative, 0/2 lymph nodes negative, ER 100%, PR 100%, Ki-67 10%, HER-2 negative ratio 1.21, T1CN0 stage Ia; Right mastectomy: DCIS intermediate grade 1.2 cm, margins negative, 0/3 lymph nodes negative, ER 100%, PR 80%, Tis NX stage 0  Pathology counseling: I discussed the final pathology report of the patient provided  a copy of this report. I discussed the margins as well as lymph node surgeries. We also discussed the final staging along with previously performed ER/PR and HER-2/neu testing.  Recommendation: 1. Oncotype DX testing on the left breast: Recurrence score 0: 3% risk of distant recurrence of 9 years 2. Followed by adjuvant antiestrogen therapy with tamoxifen 20 mg daily x5 years since the patient is still premenopausal.    Tamoxifen counseling: We discussed the risks and benefits of tamoxifen. These include but not limited to insomnia, hot flashes, mood changes, vaginal dryness, and weight gain. Although rare, serious side effects including endometrial cancer, risk of blood clots were also discussed. We strongly believe that the benefits far outweigh the risks. Patient understands these risks and consented to starting treatment. Planned treatment duration is 5 years.   Return to clinic in 4 months for survivorship care plan visit.  After that I can see her once a year.  All questions were answered. The patient knows to call the clinic with any problems, questions or concerns.    Harriette Ohara, MD 06/11/18

## 2018-06-13 ENCOUNTER — Encounter (HOSPITAL_BASED_OUTPATIENT_CLINIC_OR_DEPARTMENT_OTHER)
Admission: RE | Admit: 2018-06-13 | Discharge: 2018-06-13 | Disposition: A | Discharge status: Home / Self Care | Payer: BC Managed Care – PPO | Source: Ambulatory Visit | Attending: Plastic Surgery | Admitting: Plastic Surgery

## 2018-06-13 DIAGNOSIS — Z7982 Long term (current) use of aspirin: Secondary | ICD-10-CM | POA: Diagnosis not present

## 2018-06-13 DIAGNOSIS — Z803 Family history of malignant neoplasm of breast: Secondary | ICD-10-CM | POA: Diagnosis not present

## 2018-06-13 DIAGNOSIS — Z7984 Long term (current) use of oral hypoglycemic drugs: Secondary | ICD-10-CM | POA: Diagnosis not present

## 2018-06-13 DIAGNOSIS — Z79899 Other long term (current) drug therapy: Secondary | ICD-10-CM | POA: Diagnosis not present

## 2018-06-13 DIAGNOSIS — E119 Type 2 diabetes mellitus without complications: Secondary | ICD-10-CM | POA: Diagnosis not present

## 2018-06-13 DIAGNOSIS — Z9013 Acquired absence of bilateral breasts and nipples: Secondary | ICD-10-CM | POA: Diagnosis not present

## 2018-06-13 DIAGNOSIS — T8543XA Leakage of breast prosthesis and implant, initial encounter: Secondary | ICD-10-CM | POA: Diagnosis not present

## 2018-06-13 DIAGNOSIS — Y831 Surgical operation with implant of artificial internal device as the cause of abnormal reaction of the patient, or of later complication, without mention of misadventure at the time of the procedure: Secondary | ICD-10-CM | POA: Diagnosis not present

## 2018-06-13 DIAGNOSIS — Z853 Personal history of malignant neoplasm of breast: Secondary | ICD-10-CM | POA: Diagnosis not present

## 2018-06-13 DIAGNOSIS — Z7951 Long term (current) use of inhaled steroids: Secondary | ICD-10-CM | POA: Diagnosis not present

## 2018-06-13 LAB — BASIC METABOLIC PANEL
ANION GAP: 11 (ref 5–15)
BUN: 9 mg/dL (ref 6–20)
CO2: 25 mmol/L (ref 22–32)
Calcium: 8.8 mg/dL — ABNORMAL LOW (ref 8.9–10.3)
Chloride: 103 mmol/L (ref 98–111)
Creatinine, Ser: 0.74 mg/dL (ref 0.44–1.00)
GFR calc Af Amer: >60 mL/min (ref >60)
GFR calc non Af Amer: >60 mL/min (ref >60)
GLUCOSE: 86 mg/dL (ref 70–99)
Potassium: 4.2 mmol/L (ref 3.5–5.1)
Sodium: 139 mmol/L (ref 135–145)

## 2018-06-13 NOTE — Progress Notes (Signed)
Ensure pre surgery drink and surgical soap given with instructions, pt verbalized understanding. 

## 2018-06-14 ENCOUNTER — Ambulatory Visit (HOSPITAL_BASED_OUTPATIENT_CLINIC_OR_DEPARTMENT_OTHER): Payer: BC Managed Care – PPO | Admitting: Certified Registered"

## 2018-06-14 ENCOUNTER — Other Ambulatory Visit: Payer: Self-pay

## 2018-06-14 ENCOUNTER — Encounter (HOSPITAL_BASED_OUTPATIENT_CLINIC_OR_DEPARTMENT_OTHER)
Admission: RE | Disposition: A | Discharge status: Home / Self Care | Payer: Self-pay | Source: Ambulatory Visit | Attending: Plastic Surgery

## 2018-06-14 ENCOUNTER — Ambulatory Visit (HOSPITAL_BASED_OUTPATIENT_CLINIC_OR_DEPARTMENT_OTHER)
Admission: RE | Admit: 2018-06-14 | Discharge: 2018-06-14 | Disposition: A | Discharge status: Home / Self Care | Payer: BC Managed Care – PPO | Source: Ambulatory Visit | Attending: Plastic Surgery | Admitting: Plastic Surgery

## 2018-06-14 ENCOUNTER — Encounter (HOSPITAL_BASED_OUTPATIENT_CLINIC_OR_DEPARTMENT_OTHER): Payer: Self-pay | Admitting: Certified Registered"

## 2018-06-14 DIAGNOSIS — T8543XA Leakage of breast prosthesis and implant, initial encounter: Secondary | ICD-10-CM | POA: Insufficient documentation

## 2018-06-14 DIAGNOSIS — Z79899 Other long term (current) drug therapy: Secondary | ICD-10-CM | POA: Insufficient documentation

## 2018-06-14 DIAGNOSIS — Y831 Surgical operation with implant of artificial internal device as the cause of abnormal reaction of the patient, or of later complication, without mention of misadventure at the time of the procedure: Secondary | ICD-10-CM | POA: Insufficient documentation

## 2018-06-14 DIAGNOSIS — Z7984 Long term (current) use of oral hypoglycemic drugs: Secondary | ICD-10-CM | POA: Insufficient documentation

## 2018-06-14 DIAGNOSIS — Z9013 Acquired absence of bilateral breasts and nipples: Secondary | ICD-10-CM | POA: Insufficient documentation

## 2018-06-14 DIAGNOSIS — Z803 Family history of malignant neoplasm of breast: Secondary | ICD-10-CM | POA: Insufficient documentation

## 2018-06-14 DIAGNOSIS — Z853 Personal history of malignant neoplasm of breast: Secondary | ICD-10-CM | POA: Insufficient documentation

## 2018-06-14 DIAGNOSIS — E119 Type 2 diabetes mellitus without complications: Secondary | ICD-10-CM | POA: Insufficient documentation

## 2018-06-14 DIAGNOSIS — Z7982 Long term (current) use of aspirin: Secondary | ICD-10-CM | POA: Insufficient documentation

## 2018-06-14 DIAGNOSIS — Z7951 Long term (current) use of inhaled steroids: Secondary | ICD-10-CM | POA: Insufficient documentation

## 2018-06-14 HISTORY — PX: TISSUE EXPANDER PLACEMENT: SHX2530

## 2018-06-14 LAB — GLUCOSE, CAPILLARY
Glucose-Capillary: 210 mg/dL — ABNORMAL HIGH (ref 70–99)
Glucose-Capillary: 135 mg/dL — ABNORMAL HIGH (ref 70–99)

## 2018-06-14 SURGERY — INSERTION, TISSUE EXPANDER
Anesthesia: General | Site: Chest | Laterality: Left

## 2018-06-14 MED ORDER — ACETAMINOPHEN 500 MG PO TABS
ORAL_TABLET | ORAL | Status: AC
  Filled 2018-06-14: qty 2

## 2018-06-14 MED ORDER — FENTANYL CITRATE (PF) 100 MCG/2ML IJ SOLN: 50.0000 ug | INTRAMUSCULAR | Status: DC | PRN

## 2018-06-14 MED ORDER — CHLORHEXIDINE GLUCONATE CLOTH 2 % EX PADS: 6.0000 | MEDICATED_PAD | Freq: Once | CUTANEOUS | Status: DC

## 2018-06-14 MED ORDER — FENTANYL CITRATE (PF) 100 MCG/2ML IJ SOLN
INTRAMUSCULAR | Status: AC
  Filled 2018-06-14: qty 2

## 2018-06-14 MED ORDER — ONDANSETRON HCL 4 MG/2ML IJ SOLN
INTRAMUSCULAR | Status: AC
  Filled 2018-06-14: qty 2

## 2018-06-14 MED ORDER — ACETAMINOPHEN 500 MG PO TABS
1000.0000 mg | ORAL_TABLET | ORAL | Status: AC
  Administered 2018-06-14: 1000 mg via ORAL

## 2018-06-14 MED ORDER — GABAPENTIN 300 MG PO CAPS
300.0000 mg | ORAL_CAPSULE | ORAL | Status: AC
  Administered 2018-06-14: 300 mg via ORAL

## 2018-06-14 MED ORDER — EPHEDRINE SULFATE-NACL 50-0.9 MG/10ML-% IV SOSY
PREFILLED_SYRINGE | INTRAVENOUS | Status: DC | PRN
  Administered 2018-06-14 (×2): 5 mg via INTRAVENOUS
  Administered 2018-06-14: 10 mg via INTRAVENOUS
  Administered 2018-06-14: 5 mg via INTRAVENOUS

## 2018-06-14 MED ORDER — EPHEDRINE SULFATE 50 MG/ML IJ SOLN
INTRAMUSCULAR | Status: AC
  Filled 2018-06-14: qty 2

## 2018-06-14 MED ORDER — ONDANSETRON HCL 4 MG/2ML IJ SOLN
INTRAMUSCULAR | Status: DC | PRN
  Administered 2018-06-14: 4 mg via INTRAVENOUS

## 2018-06-14 MED ORDER — MIDAZOLAM HCL 2 MG/2ML IJ SOLN: 1.0000 mg | INTRAMUSCULAR | Status: DC | PRN

## 2018-06-14 MED ORDER — PROPOFOL 10 MG/ML IV BOLUS
INTRAVENOUS | Status: AC
  Filled 2018-06-14: qty 20

## 2018-06-14 MED ORDER — KETOROLAC TROMETHAMINE 30 MG/ML IJ SOLN
INTRAMUSCULAR | Status: DC | PRN
  Administered 2018-06-14: 30 mg via INTRAVENOUS

## 2018-06-14 MED ORDER — SODIUM CHLORIDE 0.9 % IJ SOLN
INTRAMUSCULAR | Status: AC
  Filled 2018-06-14: qty 30

## 2018-06-14 MED ORDER — SULFAMETHOXAZOLE-TRIMETHOPRIM 800-160 MG PO TABS: 1.0000 | ORAL_TABLET | Freq: Two times a day (BID) | ORAL | 0 refills | Status: DC

## 2018-06-14 MED ORDER — KETOROLAC TROMETHAMINE 30 MG/ML IJ SOLN
INTRAMUSCULAR | Status: AC
  Filled 2018-06-14: qty 1

## 2018-06-14 MED ORDER — SCOPOLAMINE 1 MG/3DAYS TD PT72: 1.0000 | MEDICATED_PATCH | Freq: Once | TRANSDERMAL | Status: DC | PRN

## 2018-06-14 MED ORDER — LACTATED RINGERS IV SOLN
INTRAVENOUS | Status: DC
  Administered 2018-06-14: 12:00:00 via INTRAVENOUS

## 2018-06-14 MED ORDER — HYDROMORPHONE HCL 1 MG/ML IJ SOLN: 0.2500 mg | INTRAMUSCULAR | Status: DC | PRN

## 2018-06-14 MED ORDER — FENTANYL CITRATE (PF) 100 MCG/2ML IJ SOLN
INTRAMUSCULAR | Status: DC | PRN
  Administered 2018-06-14: 25 ug via INTRAVENOUS
  Administered 2018-06-14: 50 ug via INTRAVENOUS
  Administered 2018-06-14: 25 ug via INTRAVENOUS

## 2018-06-14 MED ORDER — PROPOFOL 10 MG/ML IV BOLUS
INTRAVENOUS | Status: DC | PRN
  Administered 2018-06-14: 200 mg via INTRAVENOUS

## 2018-06-14 MED ORDER — GABAPENTIN 300 MG PO CAPS
ORAL_CAPSULE | ORAL | Status: AC
  Filled 2018-06-14: qty 1

## 2018-06-14 MED ORDER — CIPROFLOXACIN IN D5W 400 MG/200ML IV SOLN
400.0000 mg | INTRAVENOUS | Status: AC
  Administered 2018-06-14 (×2): 400 mg via INTRAVENOUS

## 2018-06-14 MED ORDER — LIDOCAINE HCL (CARDIAC) PF 100 MG/5ML IV SOSY
PREFILLED_SYRINGE | INTRAVENOUS | Status: AC
  Filled 2018-06-14: qty 5

## 2018-06-14 MED ORDER — MIDAZOLAM HCL 2 MG/2ML IJ SOLN
INTRAMUSCULAR | Status: AC
  Filled 2018-06-14: qty 2

## 2018-06-14 MED ORDER — LIDOCAINE 2% (20 MG/ML) 5 ML SYRINGE
INTRAMUSCULAR | Status: DC | PRN
  Administered 2018-06-14: 100 mg via INTRAVENOUS

## 2018-06-14 MED ORDER — CIPROFLOXACIN IN D5W 400 MG/200ML IV SOLN
INTRAVENOUS | Status: AC
  Filled 2018-06-14: qty 200

## 2018-06-14 MED ORDER — GLYCOPYRROLATE PF 0.2 MG/ML IJ SOSY
PREFILLED_SYRINGE | INTRAMUSCULAR | Status: AC
  Filled 2018-06-14: qty 2

## 2018-06-14 MED ORDER — DEXAMETHASONE SODIUM PHOSPHATE 10 MG/ML IJ SOLN
INTRAMUSCULAR | Status: AC
  Filled 2018-06-14: qty 1

## 2018-06-14 MED ORDER — DEXAMETHASONE SODIUM PHOSPHATE 10 MG/ML IJ SOLN
INTRAMUSCULAR | Status: DC | PRN
  Administered 2018-06-14: 5 mg via INTRAVENOUS

## 2018-06-14 MED ORDER — MIDAZOLAM HCL 5 MG/5ML IJ SOLN
INTRAMUSCULAR | Status: DC | PRN
  Administered 2018-06-14: 2 mg via INTRAVENOUS

## 2018-06-14 SURGICAL SUPPLY — 59 items
BAG DECANTER FOR FLEXI CONT (MISCELLANEOUS) ×3 IMPLANT
BINDER BREAST LRG (GAUZE/BANDAGES/DRESSINGS) IMPLANT
BINDER BREAST MEDIUM (GAUZE/BANDAGES/DRESSINGS) IMPLANT
BINDER BREAST XLRG (GAUZE/BANDAGES/DRESSINGS) ×3 IMPLANT
BINDER BREAST XXLRG (GAUZE/BANDAGES/DRESSINGS) IMPLANT
BLADE SURG 10 STRL SS (BLADE) ×3 IMPLANT
BLADE SURG 15 STRL LF DISP TIS (BLADE) IMPLANT
BLADE SURG 15 STRL SS (BLADE)
BNDG GAUZE ELAST 4 BULKY (GAUZE/BANDAGES/DRESSINGS) ×6 IMPLANT
CANISTER SUCT 1200ML W/VALVE (MISCELLANEOUS) ×3 IMPLANT
CHLORAPREP W/TINT 26ML (MISCELLANEOUS) ×3 IMPLANT
COVER BACK TABLE 60X90IN (DRAPES) ×3 IMPLANT
COVER MAYO STAND STRL (DRAPES) ×3 IMPLANT
DECANTER SPIKE VIAL GLASS SM (MISCELLANEOUS) IMPLANT
DERMABOND ADVANCED (GAUZE/BANDAGES/DRESSINGS) ×2
DERMABOND ADVANCED .7 DNX12 (GAUZE/BANDAGES/DRESSINGS) ×1 IMPLANT
DRAIN CHANNEL 15F RND FF W/TCR (WOUND CARE) IMPLANT
DRAPE TOP ARMCOVERS (MISCELLANEOUS) ×3 IMPLANT
DRAPE U-SHAPE 76X120 STRL (DRAPES) ×3 IMPLANT
DRSG PAD ABDOMINAL 8X10 ST (GAUZE/BANDAGES/DRESSINGS) ×3 IMPLANT
ELECT BLADE 4.0 EZ CLEAN MEGAD (MISCELLANEOUS) ×3
ELECT COATED BLADE 2.86 ST (ELECTRODE) ×3 IMPLANT
ELECT REM PT RETURN 9FT ADLT (ELECTROSURGICAL) ×3
ELECTRODE BLDE 4.0 EZ CLN MEGD (MISCELLANEOUS) ×1 IMPLANT
ELECTRODE REM PT RTRN 9FT ADLT (ELECTROSURGICAL) ×1 IMPLANT
EVACUATOR SILICONE 100CC (DRAIN) IMPLANT
EXPANDER TISSUE FV FOURTE 400 (Prosthesis & Implant Plastic) ×1 IMPLANT
GLOVE BIO SURGEON STRL SZ 6 (GLOVE) ×6 IMPLANT
GOWN STRL REUS W/ TWL LRG LVL3 (GOWN DISPOSABLE) ×2 IMPLANT
GOWN STRL REUS W/TWL LRG LVL3 (GOWN DISPOSABLE) ×4
IV NS 500ML (IV SOLUTION) ×2
IV NS 500ML BAXH (IV SOLUTION) ×1 IMPLANT
KIT FILL SYSTEM UNIVERSAL (SET/KITS/TRAYS/PACK) IMPLANT
MARKER SKIN DUAL TIP RULER LAB (MISCELLANEOUS) IMPLANT
NEEDLE HYPO 25X1 1.5 SAFETY (NEEDLE) IMPLANT
PACK BASIN DAY SURGERY FS (CUSTOM PROCEDURE TRAY) ×3 IMPLANT
PENCIL BUTTON HOLSTER BLD 10FT (ELECTRODE) ×3 IMPLANT
PIN SAFETY STERILE (MISCELLANEOUS) IMPLANT
SHEET MEDIUM DRAPE 40X70 STRL (DRAPES) ×6 IMPLANT
SLEEVE SCD COMPRESS KNEE MED (MISCELLANEOUS) ×3 IMPLANT
SPONGE LAP 18X18 RF (DISPOSABLE) ×6 IMPLANT
STAPLER VISISTAT 35W (STAPLE) ×3 IMPLANT
SUT ETHILON 2 0 FS 18 (SUTURE) IMPLANT
SUT MNCRL AB 4-0 PS2 18 (SUTURE) ×3 IMPLANT
SUT PDS AB 2-0 CT2 27 (SUTURE) IMPLANT
SUT SILK 2 0 SH (SUTURE) IMPLANT
SUT VIC AB 3-0 PS1 18 (SUTURE)
SUT VIC AB 3-0 PS1 18XBRD (SUTURE) IMPLANT
SUT VIC AB 3-0 SH 27 (SUTURE) ×2
SUT VIC AB 3-0 SH 27X BRD (SUTURE) ×1 IMPLANT
SUT VICRYL 4-0 PS2 18IN ABS (SUTURE) ×3 IMPLANT
SYR BULB IRRIGATION 50ML (SYRINGE) ×6 IMPLANT
SYR CONTROL 10ML LL (SYRINGE) IMPLANT
TISSUE EXPNDR FV FOURTE 400 (Prosthesis & Implant Plastic) ×3 IMPLANT
TOWEL GREEN STERILE FF (TOWEL DISPOSABLE) ×6 IMPLANT
TUBE CONNECTING 20'X1/4 (TUBING) ×2
TUBE CONNECTING 20X1/4 (TUBING) ×4 IMPLANT
UNDERPAD 30X30 (UNDERPADS AND DIAPERS) ×6 IMPLANT
YANKAUER SUCT BULB TIP NO VENT (SUCTIONS) ×3 IMPLANT

## 2018-06-14 NOTE — Anesthesia Procedure Notes (Signed)
Procedure Name: LMA Insertion Date/Time: 06/14/2018 12:09 PM Performed by: Gwyndolyn Saxon, CRNA Pre-anesthesia Checklist: Patient identified, Emergency Drugs available, Suction available, Patient being monitored and Timeout performed Patient Re-evaluated:Patient Re-evaluated prior to induction Oxygen Delivery Method: Circle system utilized Preoxygenation: Pre-oxygenation with 100% oxygen Induction Type: IV induction Ventilation: Mask ventilation without difficulty LMA: LMA inserted LMA Size: 4.0 Number of attempts: 1 Placement Confirmation: positive ETCO2,  CO2 detector and breath sounds checked- equal and bilateral Tube secured with: Tape Dental Injury: Teeth and Oropharynx as per pre-operative assessment

## 2018-06-14 NOTE — Discharge Instructions (Signed)

## 2018-06-14 NOTE — Anesthesia Postprocedure Evaluation (Signed)
Anesthesia Post Note  Patient: Maureen Mahoney  Procedure(s) Performed: removal and replacement left chest tissue expander (Left Chest)     Patient location during evaluation: PACU Anesthesia Type: General Level of consciousness: awake Pain management: pain level controlled Vital Signs Assessment: post-procedure vital signs reviewed and stable Respiratory status: spontaneous breathing Cardiovascular status: stable Postop Assessment: no headache Anesthetic complications: no    Last Vitals:  Vitals:   06/14/18 1307 06/14/18 1308  BP: (!) 146/78   Pulse:  (!) 102  Resp:  11  Temp:  36.9 C  SpO2:  97%    Last Pain:  Vitals:   06/14/18 1315  TempSrc:   PainSc: 0-No pain                 Shizuye Rupert

## 2018-06-14 NOTE — Transfer of Care (Signed)
Immediate Anesthesia Transfer of Care Note  Patient: Maureen Mahoney  Procedure(s) Performed: removal and replacement left chest tissue expander (Left Chest)  Patient Location: PACU  Anesthesia Type:General  Level of Consciousness: awake, alert  and oriented  Airway & Oxygen Therapy: Patient Spontanous Breathing and Patient connected to face mask oxygen  Post-op Assessment: Report given to RN and Post -op Vital signs reviewed and stable  Post vital signs: Reviewed and stable  Last Vitals:  Vitals Value Taken Time  BP 146/78 06/14/2018  1:07 PM  Temp    Pulse 91 06/14/2018  1:11 PM  Resp 18 06/14/2018  1:11 PM  SpO2 100 % 06/14/2018  1:11 PM  Vitals shown include unvalidated device data.  Last Pain:  Vitals:   06/14/18 1139  TempSrc: Oral  PainSc:          Complications: No apparent anesthesia complications

## 2018-06-14 NOTE — Interval H&P Note (Signed)
History and Physical Interval Note:  06/14/2018 11:21 AM  Maureen Mahoney  has presented today for surgery, with the diagnosis of history breast cancer, acquired absence breast, mechanical complication implant left  The various methods of treatment have been discussed with the patient and family. After consideration of risks, benefits and other options for treatment, the patient has consented to  Procedure(s): removal and replacement left chest tissue expander (Left) as a surgical intervention .  The patient's history has been reviewed, patient examined, no change in status, stable for surgery.  I have reviewed the patient's chart and labs.  Questions were answered to the patient's satisfaction.     Maureen Mahoney

## 2018-06-14 NOTE — Op Note (Signed)
Operative Note   DATE OF OPERATION: 8.2.2019  LOCATION: [facility]-outpatient  SURGICAL DIVISION: Plastic Surgery  PREOPERATIVE DIAGNOSES:  1. History breast cancer 2. Acquired absence breasts 3. Left tissue expander deflation  POSTOPERATIVE DIAGNOSES:  same  PROCEDURE:  Removal and replacement left chest tissue expander  SURGEON: Irene Limbo MD MBA  ASSISTANT: none  ANESTHESIA:  General.   EBL: 5 ml  COMPLICATIONS: None immediate.   INDICATIONS FOR PROCEDURE:  The patient, Maureen Mahoney, is a 52 y.o. female born on [birth year], is here for replacement of left chest tissue expander for slow loss volume following expansion. She is post operative from bilateral skin reduction mastectomies with immediate prepectoral tissue expander ADM reconstruction.   FINDINGS: Removed tissue expander with saline still in place in expander, unable to detect location of leak. Natrelle 133S-FV-12-T 400 ml tissue expander placed initial fill volume 330 ml. SN [id number]. Acellular dermis adherent to all skin flaps and chest wall and incorporating.  DESCRIPTION OF PROCEDURE:  The patient's operative site was marked with the patient in the preoperative area. The patient was taken to the operating room. SCDs were placed and IV antibiotics were given. The patient's operative site was prepped and draped in a sterile fashion. A time out was performed and all information was confirmed to be correct.  Incision made in prior IMF scar and carried through soft tissue to acellular dermis which was incised. Expander removed. Cavity irrigated with bacitracin, Ancef, gentamicinsolution.Hemostasis ensured. Cavity then irrigated with Betadine. The expander was placed inleftchest and tab secured to chest wall with 3-0 vicryl. Closure completed with 3-0 monocryl to closesuperficial fascia and ADM.4-0 monocryl used to close dermis followed by 4-0 nylon simple stitch for skin closure. Port accessed and filled  to 330 ml saline. Tissue adhesive and dry dressing applied, followed by breastbinder.  The patient was allowed to wake from anesthesia, extubated and taken to the recovery room in satisfactory condition.   SPECIMENS: none  DRAINS: none  Irene Limbo, MD [facility] Plastic & Reconstructive Surgery [phone], pin [phone]

## 2018-06-14 NOTE — Anesthesia Preprocedure Evaluation (Signed)
Anesthesia Evaluation  Patient identified by MRN, date of birth, ID band Patient awake    Reviewed: Allergy & Precautions, NPO status , Patient's Chart, lab work & pertinent test results  Airway Mallampati: II  TM Distance: >3 FB     Dental   Pulmonary neg pulmonary ROS,    breath sounds clear to auscultation       Cardiovascular negative cardio ROS   Rhythm:Regular Rate:Normal     Neuro/Psych    GI/Hepatic negative GI ROS, Neg liver ROS,   Endo/Other  diabetes  Renal/GU negative Renal ROS     Musculoskeletal   Abdominal   Peds  Hematology   Anesthesia Other Findings   Reproductive/Obstetrics                             Anesthesia Physical Anesthesia Plan  ASA: III  Anesthesia Plan: General   Post-op Pain Management:    Induction: Intravenous  PONV Risk Score and Plan: Treatment may vary due to age or medical condition, Ondansetron, Dexamethasone and Midazolam  Airway Management Planned: LMA  Additional Equipment:   Intra-op Plan:   Post-operative Plan: Extubation in OR  Informed Consent: I have reviewed the patients History and Physical, chart, labs and discussed the procedure including the risks, benefits and alternatives for the proposed anesthesia with the patient or authorized representative who has indicated his/her understanding and acceptance.   Dental advisory given  Plan Discussed with: CRNA and Anesthesiologist  Anesthesia Plan Comments:         Anesthesia Quick Evaluation

## 2018-06-17 ENCOUNTER — Encounter (HOSPITAL_BASED_OUTPATIENT_CLINIC_OR_DEPARTMENT_OTHER): Payer: Self-pay | Admitting: Plastic Surgery

## 2018-06-26 ENCOUNTER — Ambulatory Visit: Payer: BC Managed Care – PPO | Admitting: Physical Therapy

## 2018-07-03 ENCOUNTER — Encounter: Payer: Self-pay | Admitting: Rehabilitation

## 2018-07-03 ENCOUNTER — Other Ambulatory Visit: Payer: Self-pay

## 2018-07-03 ENCOUNTER — Ambulatory Visit: Payer: BC Managed Care – PPO | Attending: Plastic Surgery | Admitting: Rehabilitation

## 2018-07-03 DIAGNOSIS — M25612 Stiffness of left shoulder, not elsewhere classified: Secondary | ICD-10-CM | POA: Diagnosis present

## 2018-07-03 DIAGNOSIS — Z9189 Other specified personal risk factors, not elsewhere classified: Secondary | ICD-10-CM | POA: Diagnosis present

## 2018-07-03 DIAGNOSIS — M25611 Stiffness of right shoulder, not elsewhere classified: Secondary | ICD-10-CM

## 2018-07-03 DIAGNOSIS — Z9013 Acquired absence of bilateral breasts and nipples: Secondary | ICD-10-CM

## 2018-07-03 DIAGNOSIS — Z483 Aftercare following surgery for neoplasm: Secondary | ICD-10-CM | POA: Diagnosis present

## 2018-07-03 NOTE — Addendum Note (Signed)
Addended by: Stark Bray on: 07/03/2018 04:19 PM   Modules accepted: Orders

## 2018-07-03 NOTE — Therapy (Signed)
[place], [place], [address] Phone: [phone]   Fax:  [phone]  Physical Therapy Evaluation  Patient Details  Name: Maureen Mahoney MRN: [id number] Date of Birth: 12-26-1965 Referring Provider: Dr. Iran Planas   Encounter Date: 07/03/2018  PT End of Session - 07/03/18 1606    Visit Number  1    Number of Visits  9    Date for PT Re-Evaluation  08/07/18    PT Start Time  1350    PT Stop Time  1430    PT Time Calculation (min)  40 min    Activity Tolerance  Patient tolerated treatment well    Behavior During Therapy  [facility] for tasks assessed/performed       Past Medical History:  Diagnosis Date  . Anxiety   . Diabetes mellitus without complication ([place])   . 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 Family history of breast cancer   . Family history of lung cancer   . Family history of melanoma   . Irritable bowel disease     Past Surgical History:  Procedure Laterality Date  . BREAST RECONSTRUCTION WITH PLACEMENT OF TISSUE EXPANDER AND ALLODERM Bilateral 05/14/2018   Procedure: BILATERAL BREAST RECONSTRUCTION WITH PLACEMENT OF TISSUE EXPANDER AND ACELLULAR DERMIS;  Surgeon: Irene Limbo, MD;  Location: [place];  Service: Plastics;  Laterality: Bilateral;  . MASTECTOMY W/ SENTINEL NODE BIOPSY Bilateral 05/14/2018   Procedure: BILATERAL TOTAL MASTECTOMIES WITH BILATERAL SENTINEL LYMPH NODE BIOPSIES;  Surgeon: Rolm Bookbinder, MD;  Location: [place];  Service: General;  Laterality: Bilateral;  . TISSUE EXPANDER PLACEMENT Left 06/14/2018   Procedure: removal and replacement left chest tissue expander;  Surgeon: Irene Limbo, MD;  Location: [place];  Service: Plastics;  Laterality: Left;    There were no vitals filed for this visit.   Subjective  Assessment - 07/03/18 1354    Subjective  Started back to work 2 days ago. Have a hard time reaching up to close the trunk hatch.  Reaching for certain things high is hard.  Opening tight jars.      Pertinent History  Bilateral DCIS.  No chemotherapy or radiation and will take Tamoxifen x 5 years.  ALDN x 2 Lt and 3 Rt.  Bilateral mastectomy 05/14/18 with expander placement.  The Left expander was leaking and it was replaced 8/2.  Implants 10/18.      Currently in Pain?  Yes    Pain Score  3     Pain Location  Arm   tricep region numbness mainly    Pain Orientation  Right;Left    Pain Descriptors / Indicators  Aching    Pain Type  Neuropathic pain    Pain Radiating Towards  axilla     Pain Onset  1 to 4 weeks ago    Pain Frequency  Constant    Effect of Pain on Daily Activities  reaching up overhead can be bad          [facility] PT Assessment - 07/03/18 0001      Assessment   Medical Diagnosis  bil DCIS    Referring Provider  Dr. Iran Planas    Onset Date/Surgical Date  05/14/18    Hand Dominance  Right    Next MD Visit  unknown    Prior Therapy  no  Precautions   Precaution Comments  cancer, expanders      Restrictions   Weight Bearing Restrictions  No      Balance Screen   Has the patient fallen in the past 6 months  No    Has the patient had a decrease in activity level because of a fear of falling?   No    Is the patient reluctant to leave their home because of a fear of falling?   No      Home Film/video editor residence      Prior Function   Level of Independence  Independent      Cognition   Overall Cognitive Status  Within Functional Limits for tasks assessed      Sensation   Additional Comments  reports tricep region numbness      Posture/Postural Control   Posture/Postural Control  No significant limitations    Posture Comments  some protectiveness of the UEs      ROM / Strength   AROM / PROM / Strength  AROM;PROM;Strength       AROM   AROM Assessment Site  Shoulder    Right/Left Shoulder  Right;Left    Right Shoulder Flexion  140 Degrees    Right Shoulder ABduction  110 Degrees    Right Shoulder Internal Rotation  70 Degrees    Right Shoulder External Rotation  90 Degrees    Left Shoulder Flexion  145 Degrees    Left Shoulder ABduction  120 Degrees    Left Shoulder Internal Rotation  70 Degrees    Left Shoulder External Rotation  90 Degrees      PROM   Overall PROM Comments  apprehension and difficulty relaxing so correct measures not obtained but some limitations into flexion and abduction bilaterally     PROM Assessment Site  Shoulder    Right/Left Shoulder  Right;Left      Strength   Overall Strength Comments  strength good but with some pain during resisted IR, flex, and abduction    Strength Assessment Site  Shoulder    Right/Left Shoulder  Right;Left        LYMPHEDEMA/ONCOLOGY QUESTIONNAIRE - 07/03/18 1405      Type   Cancer Type  bil DCIS      Surgeries   Mastectomy Date  05/14/18   with expander placement   Sentinel Lymph Node Biopsy Date  05/14/18   3 Rt , 2 Lt   Other Surgery Date  --   correction of Rt expander that was leaking      Treatment   Active Chemotherapy Treatment  No    Past Chemotherapy Treatment  No    Active Radiation Treatment  No    Past Radiation Treatment  No    Current Hormone Treatment  Yes    Drug Name  tamoxifen    Past Hormone Therapy  No      What other symptoms do you have   Are you Having Heaviness or Tightness  No      Lymphedema Assessments   Lymphedema Assessments  Upper extremities      Right Upper Extremity Lymphedema   10 cm Proximal to Olecranon Process  34 cm    Olecranon Process  27.5 cm    10 cm Proximal to Ulnar Styloid Process  22 cm    Just Proximal to Ulnar Styloid Process  17 cm    Across Hand at [employer]  20.5  cm    At [facility] of 2nd Digit  6.5 cm      Left Upper Extremity Lymphedema   10 cm Proximal to Olecranon Process   34 cm    Olecranon Process  28.5 cm    10 cm Proximal to Ulnar Styloid Process  21.9 cm    Just Proximal to Ulnar Styloid Process  17 cm    Across Hand at [employer]  19.8 cm    At [place] of 2nd Digit  6.3 cm             Objective measurements completed on examination: See above findings.      [facility] Adult PT Treatment/Exercise - 07/03/18 0001      Exercises   Exercises  Other Exercises    Other Exercises   given post op shoulder ROM exercises with butterfly stretch and shoulder flexion to be performed supine              PT Education - 07/03/18 1606    Education Details  Post op shoulder exercises handout, lymphedema precautions and what it is    Person(s) Educated  Patient    Methods  Explanation;Handout;Verbal cues    Comprehension  Verbalized understanding;Returned demonstration          PT Long Term Goals - 07/03/18 1613      PT LONG TERM GOAL #1   Title  Pt will improve shoulder AROM to 150 bilaterally flexion and abduction     Time  5    Period  Weeks    Status  New    Target Date  08/07/18      PT LONG TERM GOAL #2   Title  Pt will be able to shut the car trunk without limitations     Time  5    Period  Weeks    Status  New    Target Date  08/07/18      PT LONG TERM GOAL #3   Title  Pt will report no limitations with lifting overhead at work or at home     Time  5    Period  Weeks    Status  New    Target Date  08/07/18      PT LONG TERM GOAL #4   Title  Pt will be independent with strength ABC program for continued strengthening     Time  5    Period  Weeks    Status  New    Target Date  08/07/18      PT LONG TERM GOAL #5   Title  pt will know where to obtain compression garments if wanted     Time  5    Period  Weeks    Status  New    Target Date  08/07/18             Plan - 07/03/18 1607    Clinical Impression Statement  Dellie presents today s/p bilateral mastectomy with expanders on 05/14/18 and Lt revision on  06/14/18 with some limitations of shoulder ROM into flexion and abduction actively and passively, decreased UE mobility, and lack of knowledge about lymphedema precautions and etiology.  She also has some fear of movement that should improve her ROM greatly when she gets over this.      History and Personal Factors relevant to plan of care:  Bilateral DCIS. No chemotherapy or radiation and will take Tamoxifen x 5 years. ALDN x 2 Lt  and 3 Rt. Bilateral mastectomy 05/14/18 with expander placement. The Left expander was leaking and it was replaced 8/2. Implants 10/18.     Clinical Presentation  Evolving    Clinical Presentation due to:  expander status     Clinical Decision Making  Moderate    Rehab Potential  Excellent    PT Frequency  --   1-2x per week    PT Duration  --   up to 5 weeks    PT Next Visit Plan  recheck AROM, advance HEP if pt wanting to do more indpendently.  Work on [employer] into functional reach.  Pt needs to schedule more after follow up visit     PT Home Exercise Plan  given post op stretches 07/03/18    Consulted and Agree with Plan of Care  Patient       Patient will benefit from skilled therapeutic intervention in order to improve the following deficits and impairments:  Impaired sensation, Decreased mobility, Decreased activity tolerance, Decreased range of motion, Impaired UE functional use  Visit Diagnosis: Aftercare following surgery for neoplasm  History of bilateral mastectomy  Stiffness of left shoulder, not elsewhere classified  Stiffness of right shoulder, not elsewhere classified  At risk for lymphedema     Problem List Patient Active Problem List   Diagnosis Date Noted  . Malignant neoplasm of upper-outer quadrant of left breast in female, estrogen receptor positive ([place]) 05/21/2018  . DCIS (ductal carcinoma in situ) 05/14/2018  . Genetic testing 04/01/2018  . 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 Ductal carcinoma in situ  (DCIS) of right breast 03/20/2018  . Family history of breast cancer   . Family history of lung cancer   . Family history of melanoma     Shan Levans, PT 07/03/2018, 4:17 PM  [place], [place], [address] Phone: [phone]   Fax:  [phone]  Name: KAMBRI DISMORE MRN: [id number] Date of Birth: Jun 22, 1966

## 2018-07-03 NOTE — Patient Instructions (Signed)
Post op shoulder exercises handout, lymphedema precautions and what it is

## 2018-07-10 ENCOUNTER — Ambulatory Visit: Payer: BC Managed Care – PPO

## 2018-07-17 ENCOUNTER — Ambulatory Visit: Payer: BC Managed Care – PPO | Attending: Plastic Surgery

## 2018-07-17 DIAGNOSIS — M25611 Stiffness of right shoulder, not elsewhere classified: Secondary | ICD-10-CM | POA: Insufficient documentation

## 2018-07-17 DIAGNOSIS — Z9013 Acquired absence of bilateral breasts and nipples: Secondary | ICD-10-CM | POA: Diagnosis present

## 2018-07-17 DIAGNOSIS — Z483 Aftercare following surgery for neoplasm: Secondary | ICD-10-CM | POA: Diagnosis present

## 2018-07-17 DIAGNOSIS — Z9189 Other specified personal risk factors, not elsewhere classified: Secondary | ICD-10-CM | POA: Diagnosis present

## 2018-07-17 DIAGNOSIS — M25612 Stiffness of left shoulder, not elsewhere classified: Secondary | ICD-10-CM

## 2018-07-17 NOTE — Therapy (Signed)
[place], [place], [address] Phone: [phone]   Fax:  [phone]  Physical Therapy Treatment  Patient Details  Name: Maureen Mahoney MRN: [id number] Date of Birth: 08/14/1966 Referring Provider: Dr. Iran Planas   Encounter Date: 07/17/2018  PT End of Session - 07/17/18 0922    Visit Number  2    Number of Visits  9    Date for PT Re-Evaluation  08/07/18    PT Start Time  0833    PT Stop Time  0917    PT Time Calculation (min)  44 min    Activity Tolerance  Patient tolerated treatment well    Behavior During Therapy  [facility] for tasks assessed/performed       Past Medical History:  Diagnosis Date  . Anxiety   . Diabetes mellitus without complication ([place])   . 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 Family history of breast cancer   . Family history of lung cancer   . Family history of melanoma   . Irritable bowel disease     Past Surgical History:  Procedure Laterality Date  . BREAST RECONSTRUCTION WITH PLACEMENT OF TISSUE EXPANDER AND ALLODERM Bilateral 05/14/2018   Procedure: BILATERAL BREAST RECONSTRUCTION WITH PLACEMENT OF TISSUE EXPANDER AND ACELLULAR DERMIS;  Surgeon: Irene Limbo, MD;  Location: [place];  Service: Plastics;  Laterality: Bilateral;  . MASTECTOMY W/ SENTINEL NODE BIOPSY Bilateral 05/14/2018   Procedure: BILATERAL TOTAL MASTECTOMIES WITH BILATERAL SENTINEL LYMPH NODE BIOPSIES;  Surgeon: Rolm Bookbinder, MD;  Location: [place];  Service: General;  Laterality: Bilateral;  . TISSUE EXPANDER PLACEMENT Left 06/14/2018   Procedure: removal and replacement left chest tissue expander;  Surgeon: Irene Limbo, MD;  Location: [place];  Service: Plastics;  Laterality: Left;    There were no vitals filed for this visit.  Subjective Assessment  - 07/17/18 0837    Subjective  I've been trying to use my arms more with ADLs and at work. I went back to work about 2 weeks ago and it's going okay as far as my arms go, but my endurance is just shot. I went to [employer] with friends Saturday so it was alot of driving and walking so I was exhausted Sunday.    Pertinent History  Bilateral DCIS.  No chemotherapy or radiation and will take Tamoxifen x 5 years.  ALDN x 2 Lt and 3 Rt.  Bilateral mastectomy 05/14/18 with expander placement.  The Left expander was leaking and it was replaced 8/2.  Implants 10/18.      Currently in Pain?  No/denies         [facility] PT Assessment - 07/17/18 0001      AROM   Right Shoulder Flexion  154 Degrees    Right Shoulder ABduction  147 Degrees    Left Shoulder Flexion  151 Degrees    Left Shoulder ABduction  145 Degrees                   [facility] Adult PT Treatment/Exercise - 07/17/18 0001      Shoulder Exercises: Pulleys   Flexion  2 minutes    Flexion Limitations  Pt returned demonstration and VCs to hold stretch    ABduction  2 minutes    ABduction Limitations  VCs for correct technique      Shoulder Exercises:  Therapy Ball   Flexion  Both;10 reps   Forward lean into end of stretch   Flexion Limitations  Pt returned therapist demonstration    ABduction  Right;Left;5 reps   Same side lean inot end of stretch     Manual Therapy   Manual Therapy  Myofascial release;Passive ROM    Myofascial Release  Bil UE pulling during P/ROM and Rt mastectomy incision, avoided Lt as pt reports this fully just closed about a week ago from revision surgery    Passive ROM  Bil shoulders into flexion, abduction and er to pts tolerance with more focus on Lt.                   PT Long Term Goals - 07/03/18 1613      PT LONG TERM GOAL #1   Title  Pt will improve shoulder AROM to 150 bilaterally flexion and abduction     Time  5    Period  Weeks    Status  New    Target Date  08/07/18      PT  LONG TERM GOAL #2   Title  Pt will be able to shut the car trunk without limitations     Time  5    Period  Weeks    Status  New    Target Date  08/07/18      PT LONG TERM GOAL #3   Title  Pt will report no limitations with lifting overhead at work or at home     Time  5    Period  Weeks    Status  New    Target Date  08/07/18      PT LONG TERM GOAL #4   Title  Pt will be independent with strength ABC program for continued strengthening     Time  5    Period  Weeks    Status  New    Target Date  08/07/18      PT LONG TERM GOAL #5   Title  pt will know where to obtain compression garments if wanted     Time  5    Period  Weeks    Status  New    Target Date  08/07/18            Plan - 07/17/18 6834    Clinical Impression Statement  Remeasured pts A/ROM of bil shoulders and this had improved. She reports that she has been using her arms more throughout day and this has been helping her regain ROM. Began AA/ROM today which pt tolerated very well and also P/ROM to bil shoulders. She was tighter and required VCs to relax on Lt side but was able to do so  and her end P/ROM improved by end of session. Pt also reported feeling looser. Did not progress to strengthening today as pt reports her Lt incision from revision only just completely closed last week. She R/S next visit in 2 weeks with plans to progress HEP then and possible D/C per pt request. Also began educating pt in lymphedema risk reduction and infection prevention.     Rehab Potential  Excellent    PT Frequency  --   1-2x/wk   PT Duration  --   up to 5 weeks   PT Next Visit Plan  Issue handout for lymphedema risk reduction. Recheck ROM; Issue HEP of 3 way raises and supine scapular series if pt has continued wiht no further problems  with Lt incision. See if she wants to schedule further visits.     Consulted and Agree with Plan of Care  Patient       Patient will benefit from skilled therapeutic intervention in order  to improve the following deficits and impairments:  Impaired sensation, Decreased mobility, Decreased activity tolerance, Decreased range of motion, Impaired UE functional use  Visit Diagnosis: Aftercare following surgery for neoplasm  History of bilateral mastectomy  Stiffness of left shoulder, not elsewhere classified  Stiffness of right shoulder, not elsewhere classified  At risk for lymphedema     Problem List Patient Active Problem List   Diagnosis Date Noted  . Malignant neoplasm of upper-outer quadrant of left breast in female, estrogen receptor positive ([place]) 05/21/2018  . DCIS (ductal carcinoma in situ) 05/14/2018  . Genetic testing 04/01/2018  . 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 Family history of breast cancer   . Family history of lung cancer   . Family history of melanoma     Otelia Limes, [place] 07/17/2018, 9:34 AM  [place], [place], [address] Phone: [phone]   Fax:  [phone]  Name: KARLETTA MILLAY MRN: [id number] Date of Birth: 1966-11-07

## 2018-07-31 ENCOUNTER — Ambulatory Visit: Payer: BC Managed Care – PPO

## 2018-07-31 DIAGNOSIS — M25612 Stiffness of left shoulder, not elsewhere classified: Secondary | ICD-10-CM

## 2018-07-31 DIAGNOSIS — Z483 Aftercare following surgery for neoplasm: Secondary | ICD-10-CM

## 2018-07-31 DIAGNOSIS — Z9189 Other specified personal risk factors, not elsewhere classified: Secondary | ICD-10-CM

## 2018-07-31 DIAGNOSIS — M25611 Stiffness of right shoulder, not elsewhere classified: Secondary | ICD-10-CM

## 2018-07-31 DIAGNOSIS — Z9013 Acquired absence of bilateral breasts and nipples: Secondary | ICD-10-CM

## 2018-07-31 NOTE — Patient Instructions (Signed)
3 Way Raises:       Cancer Rehab [phone]      Starting Position:  Leaning against wall, walk feet a few inches away from the wall and make tummy tight (tuck hips underneath you) Press back/shoulders/head against wall as much as possible. Keep thumbs up to ceiling, elbows straight and shoulders relaxed/down throughout.  1. Lift arms in front to shoulder height 2. Lift arms a little wider into a "V" to shoulder height 3. Lift arms out to sides in a "T" to shoulder height  Perform 10 times in each direction. Hold 1-2 lbs to start with and work up to 2-3 sets of 10/day. Perform 3-4 times/week. Increase weight as able, decreasing sets of 10 each time you increase weights, then slowly working your way back up to 2-3 sets each time.    Over Head Pull: Narrow and Wide Grip     On back, knees bent, feet flat, band across thighs, elbows straight but relaxed. Pull hands apart (start). Keeping elbows straight, bring arms up and over head, hands toward floor. Keep pull steady on band. Hold momentarily. Return slowly, keeping pull steady, back to start. Then do same with a wider grip on the band (past shoulder width) Repeat _5-10__ times. Band color __yellow____   Side Pull: Double Arm   On back, knees bent, feet flat. Arms perpendicular to body, shoulder level, elbows straight but relaxed. Pull arms out to sides, elbows straight. Resistance band comes across collarbones, hands toward floor. Hold momentarily. Slowly return to starting position. Repeat _5-10__ times. Band color _yellow____   Sword   On back, knees bent, feet flat, left hand on left hip, right hand above left. Pull right arm DIAGONALLY (hip to shoulder) across chest. Bring right arm along head toward floor. Hold momentarily. Slowly return to starting position. Repeat _5-10__ times. Do with left arm. Band color _yellow_____   Shoulder Rotation: Double Arm   On back, knees bent, feet flat, elbows tucked at sides, bent 90,  hands palms up. Pull hands apart and down toward floor, keeping elbows near sides. Hold momentarily. Slowly return to starting position. Repeat _5-10__ times. Band color __yellow____

## 2018-07-31 NOTE — Therapy (Addendum)
[place], [place], [address] Phone: [phone]   Fax:  [phone]  Physical Therapy Treatment  Patient Details  Name: Maureen Mahoney MRN: [id number] Date of Birth: 05-12-1966 Referring Provider: Dr. Iran Planas   Encounter Date: 07/31/2018  PT End of Session - 07/31/18 1328    Visit Number  3    Number of Visits  9    Date for PT Re-Evaluation  08/07/18    PT Start Time  7001    PT Stop Time  1345    PT Time Calculation (min)  42 min    Activity Tolerance  Patient tolerated treatment well    Behavior During Therapy  [facility] for tasks assessed/performed       Past Medical History:  Diagnosis Date  . Anxiety   . Diabetes mellitus without complication ([place])   . 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 Family history of breast cancer   . Family history of lung cancer   . Family history of melanoma   . Irritable bowel disease     Past Surgical History:  Procedure Laterality Date  . BREAST RECONSTRUCTION WITH PLACEMENT OF TISSUE EXPANDER AND ALLODERM Bilateral 05/14/2018   Procedure: BILATERAL BREAST RECONSTRUCTION WITH PLACEMENT OF TISSUE EXPANDER AND ACELLULAR DERMIS;  Surgeon: Irene Limbo, MD;  Location: [place];  Service: Plastics;  Laterality: Bilateral;  . MASTECTOMY W/ SENTINEL NODE BIOPSY Bilateral 05/14/2018   Procedure: BILATERAL TOTAL MASTECTOMIES WITH BILATERAL SENTINEL LYMPH NODE BIOPSIES;  Surgeon: Rolm Bookbinder, MD;  Location: [place];  Service: General;  Laterality: Bilateral;  . TISSUE EXPANDER PLACEMENT Left 06/14/2018   Procedure: removal and replacement left chest tissue expander;  Surgeon: Irene Limbo, MD;  Location: [place];  Service: Plastics;  Laterality: Left;    There were no vitals filed for this visit.  Subjective Assessment  - 07/31/18 1306    Subjective  The stretches were going good until this weekend when my back spasmed on me and I had to take muscle relaxers and it's finally calmed down some today, just still some residual tightness at my low back. My shoulder ROM has improved with doing the exercises.     Pertinent History  Bilateral DCIS.  No chemotherapy or radiation and will take Tamoxifen x 5 years.  ALDN x 2 Lt and 3 Rt.  Bilateral mastectomy 05/14/18 with expander placement.  The Left expander was leaking and it was replaced 8/2.  Implants 10/18.      Currently in Pain?  No/denies         [facility] PT Assessment - 07/31/18 0001      AROM   Right Shoulder Flexion  156 Degrees    Right Shoulder ABduction  149 Degrees    Left Shoulder Flexion  155 Degrees    Left Shoulder ABduction  157 Degrees                   [facility] Adult PT Treatment/Exercise - 07/31/18 0001      Shoulder Exercises: Supine   Horizontal ABduction  Strengthening;Both;10 reps;Theraband    Theraband Level (Shoulder Horizontal ABduction)  Level 1 (Yellow)    External Rotation  Strengthening;Both;10 reps;Theraband    Theraband Level (Shoulder External Rotation)  Level 1 (Yellow)    Flexion  Strengthening;Both;10 reps;Theraband   Narrow and Wide Grip, 10 times each  Theraband Level (Shoulder Flexion)  Level 1 (Yellow)    Diagonals  Strengthening;Right;Left;10 reps;Theraband    Theraband Level (Shoulder Diagonals)  Level 1 (Yellow)    Diagonals Limitations  Pt returning therapist demonstration of all above exercises      Shoulder Exercises: Standing   Other Standing Exercises  Bil UE 3 way raises with 1 lb with head, shoulders, and back against wall x10 each      Shoulder Exercises: Pulleys   Flexion  2 minutes    Flexion Limitations  Reviewed     ABduction  2 minutes    ABduction Limitations  VCs for correct technique      Manual Therapy   Manual Therapy  Myofascial release;Passive ROM    Myofascial Release  Bil UE  pulling during P/ROM and Rt mastectomy incision, very gently to Lt    Passive ROM  Bil shoulders into flexion, abduction and er to pts tolerance with more focus on Lt.              PT Education - 07/31/18 1324    Education Details  Standing 3 way raises and supine scapular series    Person(s) Educated  Patient    Methods  Explanation;Demonstration;Handout    Comprehension  Verbalized understanding;Returned demonstration          PT Long Term Goals - 07/03/18 1613      PT LONG TERM GOAL #1   Title  Pt will improve shoulder AROM to 150 bilaterally flexion and abduction     Time  5    Period  Weeks    Status  New    [place] Date  08/07/18      PT LONG TERM GOAL #2   Title  Pt will be able to shut the car trunk without limitations     Time  5    Period  Weeks    Status  New    Target Date  08/07/18      PT LONG TERM GOAL #3   Title  Pt will report no limitations with lifting overhead at work or at home     Time  5    Period  Weeks    Status  New    Target Date  08/07/18      PT LONG TERM GOAL #4   Title  Pt will be independent with strength ABC program for continued strengthening     Time  5    Period  Weeks    Status  New    Target Date  08/07/18      PT LONG TERM GOAL #5   Title  pt will know where to obtain compression garments if wanted     Time  5    Period  Weeks    Status  New    Target Date  08/07/18            Plan - 07/31/18 1328    Clinical Impression Statement  Pts A/ROM has improved well since start of care and she reports noticing shoulders feeling looser and able to reach further. Progressed her HEP to include strengthening in standing and supine today which she tolerated very well. Continued with P/ROM to bil shoulders to tolerance. Pt plans to come at least visit for final review.     Rehab Potential  Excellent    PT Frequency  --   1-2x/wk   PT Duration  --   up to 5 weeks  PT Treatment/Interventions  ADLs/Self Care Home  Management;DME Instruction;Therapeutic exercise;Patient/family education;Manual techniques;Passive range of motion    PT Next Visit Plan  Issue handout for lymphedema risk reduction. Recheck ROM; Issue HEP of 3 way raises and supine scapular series if pt has continued wiht no further problems with Lt incision. See if she wants to schedule further visits.     Recommended Other Services  Script sent to referring doctor for compression garment    Consulted and Agree with Plan of Care  Patient       Patient will benefit from skilled therapeutic intervention in order to improve the following deficits and impairments:  Impaired sensation, Decreased mobility, Decreased activity tolerance, Decreased range of motion, Impaired UE functional use  Visit Diagnosis: Aftercare following surgery for neoplasm  History of bilateral mastectomy  Stiffness of left shoulder, not elsewhere classified  Stiffness of right shoulder, not elsewhere classified  At risk for lymphedema     Problem List Patient Active Problem List   Diagnosis Date Noted  . Malignant neoplasm of upper-outer quadrant of left breast in female, estrogen receptor positive ([place]) 05/21/2018  . DCIS (ductal carcinoma in situ) 05/14/2018  . Genetic testing 04/01/2018  . 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 Family history of breast cancer   . Family history of lung cancer   . Family history of melanoma     Otelia Limes, PTA 07/31/2018, 1:53 PM  [place], [place], [address] Phone: [phone]   Fax:  [phone]  Name: Maureen Mahoney MRN: [id number] Date of Birth: 03/25/1966   PHYSICAL THERAPY DISCHARGE SUMMARY  Visits from Start of Care: 3  Current functional level related to goals / functional outcomes: Pt made excellent progress but di dnot  return for final visit check    Remaining deficits: Need for stretching and strengthening   Education / Equipment: Final HEP Plan: Patient agrees to discharge.  Patient goals were partially met. Patient is being discharged due to not returning since the last visit.  ?????     Shan Levans, PT 11/21/18

## 2018-07-31 NOTE — H&P (Signed)
Subjective:     Patient ID: Maureen Mahoney is a 52 y.o. female.  HPI  2.5 months post op bilateral SRM with immediate TE reconstruction. Course complicated by left expander deflation and 5 weeks  post exchange of this TE. Plan implant exchange next month.  Presented with palpable left breast mass. MMG/US with 0.4 cm RETROAREOLAR LEFT breast mass and  1.5 cm UPPER-OUTER LEFT breast mass. The 2 clips are separated by a distance of 4 cm. Over right breast UOQ calcifications noted. 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  Biopsy of RIGHT breast UO with DCIS. All ER/PR+. She reports left nipple discharge, this was present with MMG but has continued.  MRI showed biopsy-proven RIGHT breast UOQ DCIS corresponds to 1.7 cm linear stippled enhancement. RIGHT LIQ NME present.  Over LEFT breast, biopsy-proven left breast 2 o'clock DCIS presents as 1.6 cm enhancing mass, with surrounding NEM measuring 2.7 Cm. Biopsy-proven left subareolar breast DCIS presents as 3 mmprogressively enhancing nodule. 6 mm enhancing nodule in the left 5:30 o'clock breast noted. No evidence of axillary lymphadenopathy.  Additional MR biopsies RIGHT breast LIQ with intermediate grde DCIS, ER/PR+. LEFT breast 530 biopsy benign- may be discordant due to patient movement.  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   Genetics negative. Oncotype pending. Has appt Dr. Lindi Adie 7.30.19  Prior "barely C," desires full C. Right mastectomy 459 g Left 476 g  PMH includes DM, last HbA1c reports between 6-7.   Sister is Therapist, sports and instructs at [facility] [employer]. 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       Objective:   Physical Exam  Cardiovascular: Normal rate, regular rhythm and normal heart sounds.    Pulmonary/Chest: Effort normal and breath sounds normal.  Abdominal: Soft.  No hernia   Chest:  Soft, Incisions intact dry soft    Assessment:     IDC RIGHT breast, DCIS left breast Family history breast cancer S/p bilateral SRM with prepectoral TE/ADM (Alloderm) reconstruction Rupture left TE s/p removal/replacement    Plan:    Plan removal bilateral TE and placement implants, possible lipofilling. Discussed saline vs silicone, round vs shaped implants. Recommend HCG or capacity filled implants given prepectoral position and may offer less visible rippling. Reviewed risks rupture, rotation, displacement, MRI or [place] surveillance with silicone implants, contracture. Plan smooth round.  Discussed purpose fat grafting to minimize rippling, thicken mastectomy flaps. Reviewed risks including donor site pain, need for compression, fat necrosis that presents as lumps, need to repeat, variable take graft. Asked her to purchase Spanx type garment for post op use.  Additional risks including but not limited to bleeding, hematoma, seroma, DVT/PE, damage to deeper structures, asymmetry, unacceptable cosmetic result, infection requiring removal implants, need for additional surgery reviewed.  Plan OP surgery. Do not anticipate drains.  Rx for Robaxin, oxycodone, and Bactrim given. Hold ASA week prior.  RIGHT Natrelle 133FV-12-T 400 ml tissue expanders,   fill volume 360 ml saline right LEFT Natrelle 133S-FV-12-T 400 ml tissue expander, initial fill volume 330 ml    Irene Limbo, MD [facility] Plastic & Reconstructive Surgery [phone], pin [phone]

## 2018-08-19 ENCOUNTER — Encounter: Payer: Self-pay | Admitting: Internal Medicine

## 2018-08-21 ENCOUNTER — Ambulatory Visit: Payer: BC Managed Care – PPO

## 2018-08-23 ENCOUNTER — Other Ambulatory Visit: Payer: Self-pay

## 2018-08-23 ENCOUNTER — Encounter (HOSPITAL_BASED_OUTPATIENT_CLINIC_OR_DEPARTMENT_OTHER): Payer: Self-pay

## 2018-08-26 ENCOUNTER — Encounter (HOSPITAL_BASED_OUTPATIENT_CLINIC_OR_DEPARTMENT_OTHER)
Admission: RE | Admit: 2018-08-26 | Discharge: 2018-08-26 | Disposition: A | Discharge status: Home / Self Care | Payer: BC Managed Care – PPO | Source: Ambulatory Visit | Attending: Plastic Surgery | Admitting: Plastic Surgery

## 2018-08-26 DIAGNOSIS — Z01818 Encounter for other preprocedural examination: Secondary | ICD-10-CM | POA: Diagnosis not present

## 2018-08-26 LAB — BASIC METABOLIC PANEL
ANION GAP: 8 (ref 5–15)
BUN: 11 mg/dL (ref 6–20)
CALCIUM: 9.1 mg/dL (ref 8.9–10.3)
CO2: 24 mmol/L (ref 22–32)
CREATININE: 0.62 mg/dL (ref 0.44–1.00)
Chloride: 108 mmol/L (ref 98–111)
GFR calc non Af Amer: >60 mL/min (ref >60)
Glucose, Bld: 79 mg/dL (ref 70–99)
Potassium: 4.2 mmol/L (ref 3.5–5.1)
Sodium: 140 mmol/L (ref 135–145)

## 2018-08-26 NOTE — Progress Notes (Signed)
Ensure pre-surgery drink given to patient with instructions to complete by 0515.  Surgical soap given with instructions for use.  Patient verbalized understanding of instructions.

## 2018-08-28 ENCOUNTER — Ambulatory Visit: Payer: BC Managed Care – PPO

## 2018-08-30 ENCOUNTER — Ambulatory Visit (HOSPITAL_BASED_OUTPATIENT_CLINIC_OR_DEPARTMENT_OTHER)
Admission: RE | Admit: 2018-08-30 | Discharge: 2018-08-30 | Disposition: A | Discharge status: Home / Self Care | Payer: BC Managed Care – PPO | Source: Ambulatory Visit | Attending: Plastic Surgery | Admitting: Plastic Surgery

## 2018-08-30 ENCOUNTER — Ambulatory Visit (HOSPITAL_BASED_OUTPATIENT_CLINIC_OR_DEPARTMENT_OTHER): Payer: BC Managed Care – PPO | Admitting: Anesthesiology

## 2018-08-30 ENCOUNTER — Other Ambulatory Visit: Payer: Self-pay

## 2018-08-30 ENCOUNTER — Encounter (HOSPITAL_BASED_OUTPATIENT_CLINIC_OR_DEPARTMENT_OTHER): Payer: Self-pay

## 2018-08-30 ENCOUNTER — Encounter (HOSPITAL_BASED_OUTPATIENT_CLINIC_OR_DEPARTMENT_OTHER)
Admission: RE | Disposition: A | Discharge status: Home / Self Care | Payer: Self-pay | Source: Ambulatory Visit | Attending: Plastic Surgery

## 2018-08-30 DIAGNOSIS — E119 Type 2 diabetes mellitus without complications: Secondary | ICD-10-CM | POA: Insufficient documentation

## 2018-08-30 DIAGNOSIS — Z7984 Long term (current) use of oral hypoglycemic drugs: Secondary | ICD-10-CM | POA: Insufficient documentation

## 2018-08-30 DIAGNOSIS — Z9013 Acquired absence of bilateral breasts and nipples: Secondary | ICD-10-CM | POA: Diagnosis not present

## 2018-08-30 DIAGNOSIS — Z803 Family history of malignant neoplasm of breast: Secondary | ICD-10-CM | POA: Diagnosis not present

## 2018-08-30 DIAGNOSIS — Z7982 Long term (current) use of aspirin: Secondary | ICD-10-CM | POA: Diagnosis not present

## 2018-08-30 DIAGNOSIS — Z79899 Other long term (current) drug therapy: Secondary | ICD-10-CM | POA: Insufficient documentation

## 2018-08-30 DIAGNOSIS — Z853 Personal history of malignant neoplasm of breast: Secondary | ICD-10-CM | POA: Insufficient documentation

## 2018-08-30 HISTORY — PX: LIPOSUCTION WITH LIPOFILLING: SHX6436

## 2018-08-30 HISTORY — PX: REMOVAL OF BILATERAL TISSUE EXPANDERS WITH PLACEMENT OF BILATERAL BREAST IMPLANTS: SHX6431

## 2018-08-30 LAB — POCT PREGNANCY, URINE: Preg Test, Ur: NEGATIVE

## 2018-08-30 LAB — GLUCOSE, CAPILLARY
GLUCOSE-CAPILLARY: 132 mg/dL — AB (ref 70–99)
Glucose-Capillary: 127 mg/dL — ABNORMAL HIGH (ref 70–99)

## 2018-08-30 SURGERY — REMOVAL, TISSUE EXPANDER, BREAST, BILATERAL, WITH BILATERAL IMPLANT IMPLANT INSERTION
Anesthesia: General | Site: Chest | Laterality: Bilateral

## 2018-08-30 MED ORDER — ACETAMINOPHEN 500 MG PO TABS
1000.0000 mg | ORAL_TABLET | ORAL | Status: AC
  Administered 2018-08-30: 1000 mg via ORAL

## 2018-08-30 MED ORDER — SODIUM BICARBONATE 4 % IV SOLN
INTRAVENOUS | Status: AC
  Filled 2018-08-30: qty 10

## 2018-08-30 MED ORDER — CHLORHEXIDINE GLUCONATE CLOTH 2 % EX PADS: 6.0000 | MEDICATED_PAD | Freq: Once | CUTANEOUS | Status: DC

## 2018-08-30 MED ORDER — FENTANYL CITRATE (PF) 100 MCG/2ML IJ SOLN
INTRAMUSCULAR | Status: AC
  Filled 2018-08-30: qty 2

## 2018-08-30 MED ORDER — OXYCODONE HCL 5 MG PO TABS
5.0000 mg | ORAL_TABLET | Freq: Once | ORAL | Status: AC
  Administered 2018-08-30: 5 mg via ORAL

## 2018-08-30 MED ORDER — ACETAMINOPHEN 500 MG PO TABS
ORAL_TABLET | ORAL | Status: AC
  Filled 2018-08-30: qty 2

## 2018-08-30 MED ORDER — FENTANYL CITRATE (PF) 100 MCG/2ML IJ SOLN
50.0000 ug | INTRAMUSCULAR | Status: AC | PRN
  Administered 2018-08-30: 50 ug via INTRAVENOUS
  Administered 2018-08-30: 100 ug via INTRAVENOUS
  Administered 2018-08-30: 50 ug via INTRAVENOUS

## 2018-08-30 MED ORDER — GABAPENTIN 300 MG PO CAPS
300.0000 mg | ORAL_CAPSULE | ORAL | Status: AC
  Administered 2018-08-30: 300 mg via ORAL

## 2018-08-30 MED ORDER — DEXAMETHASONE SODIUM PHOSPHATE 10 MG/ML IJ SOLN
INTRAMUSCULAR | Status: AC
  Filled 2018-08-30: qty 1

## 2018-08-30 MED ORDER — CELECOXIB 200 MG PO CAPS
ORAL_CAPSULE | ORAL | Status: AC
  Filled 2018-08-30: qty 1

## 2018-08-30 MED ORDER — SUGAMMADEX SODIUM 500 MG/5ML IV SOLN
INTRAVENOUS | Status: AC
  Filled 2018-08-30: qty 5

## 2018-08-30 MED ORDER — LIDOCAINE HCL (PF) 1 % IJ SOLN
INTRAMUSCULAR | Status: AC
  Filled 2018-08-30: qty 60

## 2018-08-30 MED ORDER — SODIUM CHLORIDE 0.9 % IV SOLN
INTRAVENOUS | Status: DC | PRN
  Administered 2018-08-30: 10:00:00

## 2018-08-30 MED ORDER — FENTANYL CITRATE (PF) 100 MCG/2ML IJ SOLN
25.0000 ug | INTRAMUSCULAR | Status: DC | PRN
  Administered 2018-08-30: 25 ug via INTRAVENOUS
  Administered 2018-08-30: 50 ug via INTRAVENOUS

## 2018-08-30 MED ORDER — CELECOXIB 200 MG PO CAPS
200.0000 mg | ORAL_CAPSULE | ORAL | Status: AC
  Administered 2018-08-30: 200 mg via ORAL

## 2018-08-30 MED ORDER — PHENYLEPHRINE 40 MCG/ML (10ML) SYRINGE FOR IV PUSH (FOR BLOOD PRESSURE SUPPORT)
PREFILLED_SYRINGE | INTRAVENOUS | Status: AC
  Filled 2018-08-30: qty 10

## 2018-08-30 MED ORDER — LIDOCAINE 2% (20 MG/ML) 5 ML SYRINGE
INTRAMUSCULAR | Status: AC
  Filled 2018-08-30: qty 5

## 2018-08-30 MED ORDER — ROCURONIUM BROMIDE 100 MG/10ML IV SOLN
INTRAVENOUS | Status: DC | PRN
  Administered 2018-08-30: 50 mg via INTRAVENOUS

## 2018-08-30 MED ORDER — CIPROFLOXACIN IN D5W 400 MG/200ML IV SOLN
INTRAVENOUS | Status: AC
  Filled 2018-08-30: qty 200

## 2018-08-30 MED ORDER — LIDOCAINE HCL (CARDIAC) PF 100 MG/5ML IV SOSY
PREFILLED_SYRINGE | INTRAVENOUS | Status: DC | PRN
  Administered 2018-08-30: 50 mg via INTRAVENOUS

## 2018-08-30 MED ORDER — CIPROFLOXACIN IN D5W 400 MG/200ML IV SOLN
400.0000 mg | INTRAVENOUS | Status: AC
  Administered 2018-08-30: 400 mg via INTRAVENOUS

## 2018-08-30 MED ORDER — SUCCINYLCHOLINE CHLORIDE 200 MG/10ML IV SOSY
PREFILLED_SYRINGE | INTRAVENOUS | Status: AC
  Filled 2018-08-30: qty 10

## 2018-08-30 MED ORDER — ONDANSETRON HCL 4 MG/2ML IJ SOLN
INTRAMUSCULAR | Status: DC | PRN
  Administered 2018-08-30: 4 mg via INTRAVENOUS

## 2018-08-30 MED ORDER — SCOPOLAMINE 1 MG/3DAYS TD PT72
MEDICATED_PATCH | TRANSDERMAL | Status: AC
  Filled 2018-08-30: qty 1

## 2018-08-30 MED ORDER — OXYCODONE HCL 5 MG PO TABS
ORAL_TABLET | ORAL | Status: AC
  Filled 2018-08-30: qty 1

## 2018-08-30 MED ORDER — SODIUM CHLORIDE 0.9 % IN NEBU
INHALATION_SOLUTION | RESPIRATORY_TRACT | Status: AC
  Filled 2018-08-30: qty 3

## 2018-08-30 MED ORDER — EPINEPHRINE 30 MG/30ML IJ SOLN
INTRAMUSCULAR | Status: AC
  Filled 2018-08-30: qty 1

## 2018-08-30 MED ORDER — MIDAZOLAM HCL 2 MG/2ML IJ SOLN
INTRAMUSCULAR | Status: AC
  Filled 2018-08-30: qty 2

## 2018-08-30 MED ORDER — EPHEDRINE SULFATE 50 MG/ML IJ SOLN
INTRAMUSCULAR | Status: DC | PRN
  Administered 2018-08-30: 10 mg via INTRAVENOUS

## 2018-08-30 MED ORDER — PROMETHAZINE HCL 25 MG/ML IJ SOLN: 6.2500 mg | INTRAMUSCULAR | Status: DC | PRN

## 2018-08-30 MED ORDER — LACTATED RINGERS IV SOLN
INTRAVENOUS | Status: DC
  Administered 2018-08-30 (×3): via INTRAVENOUS

## 2018-08-30 MED ORDER — ONDANSETRON HCL 4 MG/2ML IJ SOLN
INTRAMUSCULAR | Status: AC
  Filled 2018-08-30: qty 2

## 2018-08-30 MED ORDER — SCOPOLAMINE 1 MG/3DAYS TD PT72: 1.0000 | MEDICATED_PATCH | Freq: Once | TRANSDERMAL | Status: DC | PRN

## 2018-08-30 MED ORDER — SODIUM BICARBONATE 4 % IV SOLN
INTRAVENOUS | Status: DC | PRN
  Administered 2018-08-30: 400 mL via INTRAMUSCULAR

## 2018-08-30 MED ORDER — MIDAZOLAM HCL 2 MG/2ML IJ SOLN
1.0000 mg | INTRAMUSCULAR | Status: DC | PRN
  Administered 2018-08-30: 2 mg via INTRAVENOUS

## 2018-08-30 MED ORDER — SUGAMMADEX SODIUM 500 MG/5ML IV SOLN
INTRAVENOUS | Status: DC | PRN
  Administered 2018-08-30: 200 mg via INTRAVENOUS

## 2018-08-30 MED ORDER — DEXAMETHASONE SODIUM PHOSPHATE 4 MG/ML IJ SOLN
INTRAMUSCULAR | Status: DC | PRN
  Administered 2018-08-30: 10 mg via INTRAVENOUS

## 2018-08-30 MED ORDER — EPHEDRINE 5 MG/ML INJ
INTRAVENOUS | Status: AC
  Filled 2018-08-30: qty 10

## 2018-08-30 MED ORDER — GABAPENTIN 300 MG PO CAPS
ORAL_CAPSULE | ORAL | Status: AC
  Filled 2018-08-30: qty 1

## 2018-08-30 SURGICAL SUPPLY — 83 items
BAG DECANTER FOR FLEXI CONT (MISCELLANEOUS) ×4 IMPLANT
BINDER ABDOMINAL 10 UNV 27-48 (MISCELLANEOUS) IMPLANT
BINDER ABDOMINAL 12 ML 46-62 (SOFTGOODS) ×4 IMPLANT
BINDER ABDOMINAL 12 SM 30-45 (SOFTGOODS) IMPLANT
BINDER BREAST 3XL (GAUZE/BANDAGES/DRESSINGS) IMPLANT
BINDER BREAST LRG (GAUZE/BANDAGES/DRESSINGS) IMPLANT
BINDER BREAST MEDIUM (GAUZE/BANDAGES/DRESSINGS) IMPLANT
BINDER BREAST XLRG (GAUZE/BANDAGES/DRESSINGS) IMPLANT
BINDER BREAST XXLRG (GAUZE/BANDAGES/DRESSINGS) ×4 IMPLANT
BLADE SURG 10 STRL SS (BLADE) ×16 IMPLANT
BLADE SURG 11 STRL SS (BLADE) ×4 IMPLANT
BNDG GAUZE ELAST 4 BULKY (GAUZE/BANDAGES/DRESSINGS) ×8 IMPLANT
CANISTER LIPO FAT HARVEST (MISCELLANEOUS) ×4 IMPLANT
CANISTER SUCT 1200ML W/VALVE (MISCELLANEOUS) ×8 IMPLANT
CHLORAPREP W/TINT 26ML (MISCELLANEOUS) ×8 IMPLANT
COVER BACK TABLE 60X90IN (DRAPES) ×4 IMPLANT
COVER MAYO STAND STRL (DRAPES) ×8 IMPLANT
COVER WAND RF STERILE (DRAPES) IMPLANT
DECANTER SPIKE VIAL GLASS SM (MISCELLANEOUS) IMPLANT
DERMABOND ADVANCED (GAUZE/BANDAGES/DRESSINGS) ×4
DERMABOND ADVANCED .7 DNX12 (GAUZE/BANDAGES/DRESSINGS) ×4 IMPLANT
DRAIN CHANNEL 15F RND FF W/TCR (WOUND CARE) IMPLANT
DRAPE TOP ARMCOVERS (MISCELLANEOUS) ×4 IMPLANT
DRAPE U-SHAPE 76X120 STRL (DRAPES) ×4 IMPLANT
DRSG PAD ABDOMINAL 8X10 ST (GAUZE/BANDAGES/DRESSINGS) ×16 IMPLANT
ELECT BLADE 4.0 EZ CLEAN MEGAD (MISCELLANEOUS) ×4
ELECT COATED BLADE 2.86 ST (ELECTRODE) IMPLANT
ELECT REM PT RETURN 9FT ADLT (ELECTROSURGICAL) ×4
ELECTRODE BLDE 4.0 EZ CLN MEGD (MISCELLANEOUS) ×2 IMPLANT
ELECTRODE REM PT RTRN 9FT ADLT (ELECTROSURGICAL) ×2 IMPLANT
EVACUATOR SILICONE 100CC (DRAIN) IMPLANT
GLOVE BIO SURGEON STRL SZ 6 (GLOVE) ×12 IMPLANT
GLOVE BIOGEL PI IND STRL 7.0 (GLOVE) ×4 IMPLANT
GLOVE BIOGEL PI IND STRL 7.5 (GLOVE) ×2 IMPLANT
GLOVE BIOGEL PI INDICATOR 7.0 (GLOVE) ×4
GLOVE BIOGEL PI INDICATOR 7.5 (GLOVE) ×2
GLOVE ECLIPSE 6.5 STRL STRAW (GLOVE) ×4 IMPLANT
GOWN STRL REUS W/ TWL LRG LVL3 (GOWN DISPOSABLE) ×6 IMPLANT
GOWN STRL REUS W/TWL LRG LVL3 (GOWN DISPOSABLE) ×6
IMPL BREAST INSPI SRX 545CC (Breast) ×4 IMPLANT
IMPLANT BREAST INSPI SRX 545CC (Breast) ×8 IMPLANT
IV NS 500ML (IV SOLUTION)
IV NS 500ML BAXH (IV SOLUTION) IMPLANT
KIT FILL SYSTEM UNIVERSAL (SET/KITS/TRAYS/PACK) IMPLANT
LINER CANISTER 1000CC FLEX (MISCELLANEOUS) ×4 IMPLANT
MARKER SKIN DUAL TIP RULER LAB (MISCELLANEOUS) IMPLANT
NDL SAFETY ECLIPSE 18X1.5 (NEEDLE) ×2 IMPLANT
NEEDLE HYPO 18GX1.5 SHARP (NEEDLE) ×2
NEEDLE HYPO 25X1 1.5 SAFETY (NEEDLE) IMPLANT
NS IRRIG 1000ML POUR BTL (IV SOLUTION) IMPLANT
PACK BASIN DAY SURGERY FS (CUSTOM PROCEDURE TRAY) ×4 IMPLANT
PAD ALCOHOL SWAB (MISCELLANEOUS) ×4 IMPLANT
PENCIL BUTTON HOLSTER BLD 10FT (ELECTRODE) ×4 IMPLANT
PIN SAFETY STERILE (MISCELLANEOUS) IMPLANT
PUNCH BIOPSY DERMAL 4MM (MISCELLANEOUS) IMPLANT
SHEET MEDIUM DRAPE 40X70 STRL (DRAPES) ×8 IMPLANT
SIZER BREAST REUSE GEL 525CC (SIZER) ×4
SIZER BREAST REUSE GEL 545CC (SIZER) ×4
SIZER BRST REUSE GEL 525CC (SIZER) ×2 IMPLANT
SIZER BRST REUSE GEL 545CC (SIZER) ×2 IMPLANT
SLEEVE SCD COMPRESS KNEE MED (MISCELLANEOUS) ×4 IMPLANT
SPONGE LAP 18X18 RF (DISPOSABLE) ×8 IMPLANT
STAPLER VISISTAT 35W (STAPLE) ×4 IMPLANT
SUT ETHILON 2 0 FS 18 (SUTURE) IMPLANT
SUT MNCRL AB 4-0 PS2 18 (SUTURE) ×8 IMPLANT
SUT PDS AB 2-0 CT2 27 (SUTURE) IMPLANT
SUT VIC AB 3-0 PS1 18 (SUTURE)
SUT VIC AB 3-0 PS1 18XBRD (SUTURE) IMPLANT
SUT VIC AB 3-0 SH 27 (SUTURE) ×4
SUT VIC AB 3-0 SH 27X BRD (SUTURE) ×4 IMPLANT
SUT VICRYL 4-0 PS2 18IN ABS (SUTURE) ×8 IMPLANT
SYR 10ML LL (SYRINGE) ×12 IMPLANT
SYR 50ML LL SCALE MARK (SYRINGE) ×16 IMPLANT
SYR BULB IRRIGATION 50ML (SYRINGE) ×8 IMPLANT
SYR CONTROL 10ML LL (SYRINGE) IMPLANT
SYR TB 1ML LL NO SAFETY (SYRINGE) IMPLANT
TOWEL GREEN STERILE FF (TOWEL DISPOSABLE) ×8 IMPLANT
TUBE CONNECTING 20'X1/4 (TUBING) ×1
TUBE CONNECTING 20X1/4 (TUBING) ×3 IMPLANT
TUBING INFILTRATION IT-10001 (TUBING) ×4 IMPLANT
TUBING SET GRADUATE ASPIR 12FT (MISCELLANEOUS) ×4 IMPLANT
UNDERPAD 30X30 (UNDERPADS AND DIAPERS) ×8 IMPLANT
YANKAUER SUCT BULB TIP NO VENT (SUCTIONS) ×8 IMPLANT

## 2018-08-30 NOTE — Op Note (Signed)
Operative Note   DATE OF OPERATION: 10.18.19  LOCATION: [facility]-outpatient   SURGICAL DIVISION: Plastic Surgery  PREOPERATIVE DIAGNOSES:  1. History breast cancer 2. Acquired absence breasts  POSTOPERATIVE DIAGNOSES:  same  PROCEDURE:  1. Removal bilateral tissue expanders and placement silicone implants 2. Lipofilling to bilateral chest  SURGEON: Irene Limbo MD MBA  ASSISTANT: none  ANESTHESIA:  General.   EBL: 10 ml  COMPLICATIONS: None immediate.   INDICATIONS FOR PROCEDURE:  The patient, Maureen Mahoney, is a 52 y.o. female born on 1966-10-21, is here for staged breast reconstruction following bilateral skin reduction pattern mastectomies with immediate prepectoral tissue expander acellular dermis reconstruction.    FINDINGS: Full incorporation ADM noted bilateral. 50 ml fat infiltrated over each chest. Natrelle Inspira Smooth Round Extra Projection 545 ml implants placed bilateral. REF SRX-545 RIGHT SN [id number] LEFT SN [id number]  DESCRIPTION OF PROCEDURE:  The patient's operative site was marked with the patient in the preoperative area to mark sternal notch, chest midline, anterior axillary lines. Supra and infraumbilical marked for liposuction.The patientwas taken to the operating room. SCDs were placed and IV antibiotics were given. The patient's operative site was prepped and draped in a sterile fashion. A time out was performed and all information was confirmed to be correct.I began onleft side. Incision made through priorinframammary foldscar and carried through superficial fascia to acellular demis.ADM incised.Expander removed.Incorporated ADM noted. Capsulotomies performed superiorly and laterally. Sizer placed.  I then directed attention torightbreast andimplant cavityentered in similar manner. Expander removed. Capsulotomies performed over upper outer quadrant.Sizer placed and patient brought to upright sitting position. Natrelle Smooth  RoundExtra Projection 545 mlimplants selected for bilateral placement.The patient was returned to supine position.  Stab incision made over bilateralabdomenand tumescent fluid infiltrated over bilateral flanks,total356ml tumescent infiltrated. Power assisted liposuction performed to endpoint symmetric contour and soft tissue thickness, total lipoaspirate234ml. The fat was then washed and prepared by gravity for infiltration. Harvested fat was then infiltrated in subcutaneous plane throughout total envelope mastectomy flaps.  Each cavity irrigated with bacitracin, Ancef, gentamicinsolution.Hemostasis ensured.Each cavity then irrigated with Betadine. The implant was placed inrightchest andimplant orientation ensured. Closure completed with 3-0 vicryl to closesuperficial fascia and ADMover implant.4-0 vicryl used to close dermis followed by 4-0 monocryl subcuticular. Implant placed inleftchest cavity.Closure completedin similar fashion.Abdomen incisions approximated with simple 4-0 monocryl stitch.Tissue adhesive and dry dressing applied, followed by breastbinderand abdominal compression garment.  The patient was allowed to wake from anesthesia, extubated and taken to the recovery room in satisfactory condition.   SPECIMENS: none  DRAINS: none  Irene Limbo, MD [facility] Plastic & Reconstructive Surgery [phone], pin [phone]

## 2018-08-30 NOTE — Interval H&P Note (Signed)
History and Physical Interval Note:  08/30/2018 7:23 AM  Maureen Mahoney  has presented today for surgery, with the diagnosis of history breast cancer, acquired absence breasts  The various methods of treatment have been discussed with the patient and family. After consideration of risks, benefits and other options for treatment, the patient has consented to  Removal bilateral tissue expanders placement silicone implants, lipofilling from abdomen to bilateral chest as a surgical intervention .  The patient's history has been reviewed, patient examined, no change in status, stable for surgery.  I have reviewed the patient's chart and labs.  Questions were answered to the patient's satisfaction.     Arnoldo Hooker Melia Hopes

## 2018-08-30 NOTE — Discharge Instructions (Signed)
*  1,000mg  Tylenol given at 8:00am* NO TYLENOL BEFORE pm!  Blood Sugar - 132 at 10:42am   Post Anesthesia Home Care Instructions  Activity: Get plenty of rest for the remainder of the day. A responsible individual must stay with you for 24 hours following the procedure.  For the next 24 hours, DO NOT: -Drive a car -Paediatric nurse -Drink alcoholic beverages -Take any medication unless instructed by your physician -Make any legal decisions or sign important papers.  Meals: Start with liquid foods such as gelatin or soup. Progress to regular foods as tolerated. Avoid greasy, spicy, heavy foods. If nausea and/or vomiting occur, drink only clear liquids until the nausea and/or vomiting subsides. Call your physician if vomiting continues.  Special Instructions/Symptoms: Your throat may feel dry or sore from the anesthesia or the breathing tube placed in your throat during surgery. If this causes discomfort, gargle with warm salt water. The discomfort should disappear within 24 hours.  If you had a scopolamine patch placed behind your ear for the management of post- operative nausea and/or vomiting:  1. The medication in the patch is effective for 72 hours, after which it should be removed.  Wrap patch in a tissue and discard in the trash. Wash hands thoroughly with soap and water. 2. You may remove the patch earlier than 72 hours if you experience unpleasant side effects which may include dry mouth, dizziness or visual disturbances. 3. Avoid touching the patch. Wash your hands with soap and water after contact with the patch.

## 2018-08-30 NOTE — Anesthesia Postprocedure Evaluation (Signed)
Anesthesia Post Note  Patient: ANETH SCHLAGEL  Procedure(s) Performed: REMOVAL OF BILATERAL TISSUE EXPANDERS WITH PLACEMENT OF BILATERAL BREAST IMPLANTS (Bilateral Breast) LIPOFILLING FROM ABDOMEN TO BILATERAL CHEST (Bilateral Chest)     Patient location during evaluation: PACU Anesthesia Type: General Level of consciousness: sedated Pain management: pain level controlled Vital Signs Assessment: post-procedure vital signs reviewed and stable Respiratory status: spontaneous breathing and respiratory function stable Cardiovascular status: stable Postop Assessment: no apparent nausea or vomiting Anesthetic complications: no    Last Vitals:  Vitals:   08/30/18 1100 08/30/18 1115  BP: (!) 141/70 134/69  Pulse: 75 79  Resp: 15 15  Temp:    SpO2: 91% 95%    Last Pain:  Vitals:   08/30/18 1115  TempSrc:   PainSc: 4                  Jaidence Geisler DANIEL

## 2018-08-30 NOTE — Anesthesia Procedure Notes (Addendum)
Procedure Name: Intubation Date/Time: 08/30/2018 8:24 AM Performed by: Willa Frater, CRNA Pre-anesthesia Checklist: Patient identified, Emergency Drugs available, Suction available and Patient being monitored Patient Re-evaluated:Patient Re-evaluated prior to induction Oxygen Delivery Method: Circle system utilized Preoxygenation: Pre-oxygenation with 100% oxygen Induction Type: IV induction Ventilation: Mask ventilation without difficulty Laryngoscope Size: Mac and 3 Grade View: Grade II Tube type: Oral Number of attempts: 1 Airway Equipment and Method: Stylet and Oral airway Placement Confirmation: ETT inserted through vocal cords under direct vision,  positive ETCO2 and breath sounds checked- equal and bilateral Secured at: 22 cm Tube secured with: Tape Dental Injury: Teeth and Oropharynx as per pre-operative assessment  Difficulty Due To: Difficulty was anticipated, Difficult Airway- due to anterior larynx and Difficult Airway- due to limited oral opening

## 2018-08-30 NOTE — Transfer of Care (Signed)
Immediate Anesthesia Transfer of Care Note  Patient: Maureen Mahoney  Procedure(s) Performed: REMOVAL OF BILATERAL TISSUE EXPANDERS WITH PLACEMENT OF BILATERAL BREAST IMPLANTS (Bilateral Breast) LIPOFILLING FROM ABDOMEN TO BILATERAL CHEST (Bilateral Chest)  Patient Location: PACU  Anesthesia Type:General  Level of Consciousness: awake, alert  and oriented  Airway & Oxygen Therapy: Patient Spontanous Breathing and Patient connected to face mask oxygen  Post-op Assessment: Report given to RN and Post -op Vital signs reviewed and stable  Post vital signs: Reviewed and stable  Last Vitals:  Vitals Value Taken Time  BP 155/84 08/30/2018 10:33 AM  Temp    Pulse 84 08/30/2018 10:34 AM  Resp 17 08/30/2018 10:34 AM  SpO2 98 % 08/30/2018 10:34 AM  Vitals shown include unvalidated device data.  Last Pain:  Vitals:   08/30/18 0751  TempSrc: Oral  PainSc: 0-No pain      Patients Stated Pain Goal: 4 (27/74/12 8786)  Complications: No apparent anesthesia complications

## 2018-08-30 NOTE — Addendum Note (Signed)
Addendum  created 08/30/18 1407 by Willa Frater, CRNA   Charge Capture section accepted

## 2018-08-30 NOTE — Anesthesia Preprocedure Evaluation (Addendum)
Anesthesia Evaluation  Patient identified by MRN, date of birth, ID band Patient awake    Reviewed: Allergy & Precautions, NPO status , Patient's Chart, lab work & pertinent test results  History of Anesthesia Complications Negative for: history of anesthetic complications  Airway Mallampati: II  TM Distance: >3 FB Neck ROM: Full    Dental no notable dental hx. (+) Dental Advisory Given   Pulmonary neg pulmonary ROS,    breath sounds clear to auscultation       Cardiovascular negative cardio ROS   Rhythm:Regular Rate:Normal     Neuro/Psych Anxiety    GI/Hepatic negative GI ROS, Neg liver ROS,   Endo/Other  diabetes  Renal/GU negative Renal ROS     Musculoskeletal   Abdominal   Peds  Hematology   Anesthesia Other Findings   Reproductive/Obstetrics                            Anesthesia Physical  Anesthesia Plan  ASA: II  Anesthesia Plan: General   Post-op Pain Management:    Induction: Intravenous  PONV Risk Score and Plan: 3 and Treatment may vary due to age or medical condition, Ondansetron, Dexamethasone and Scopolamine patch - Pre-op  Airway Management Planned: LMA  Additional Equipment:   Intra-op Plan:   Post-operative Plan: Extubation in OR  Informed Consent: I have reviewed the patients History and Physical, chart, labs and discussed the procedure including the risks, benefits and alternatives for the proposed anesthesia with the patient or authorized representative who has indicated his/her understanding and acceptance.   Dental advisory given  Plan Discussed with: CRNA, Anesthesiologist and Surgeon  Anesthesia Plan Comments:        Anesthesia Quick Evaluation

## 2018-09-02 ENCOUNTER — Encounter (HOSPITAL_BASED_OUTPATIENT_CLINIC_OR_DEPARTMENT_OTHER): Payer: Self-pay | Admitting: Plastic Surgery

## 2018-10-09 ENCOUNTER — Telehealth: Payer: Self-pay

## 2018-10-09 ENCOUNTER — Ambulatory Visit (INDEPENDENT_AMBULATORY_CARE_PROVIDER_SITE_OTHER): Payer: BC Managed Care – PPO | Admitting: Nurse Practitioner

## 2018-10-09 ENCOUNTER — Other Ambulatory Visit: Payer: Self-pay

## 2018-10-09 ENCOUNTER — Encounter: Payer: Self-pay | Admitting: Nurse Practitioner

## 2018-10-09 DIAGNOSIS — R69 Illness, unspecified: Secondary | ICD-10-CM

## 2018-10-09 DIAGNOSIS — K649 Unspecified hemorrhoids: Secondary | ICD-10-CM

## 2018-10-09 DIAGNOSIS — Z1211 Encounter for screening for malignant neoplasm of colon: Secondary | ICD-10-CM

## 2018-10-09 MED ORDER — NA SULFATE-K SULFATE-MG SULF 17.5-3.13-1.6 GM/177ML PO SOLN: 1.0000 | ORAL | 0 refills | Status: DC

## 2018-10-09 NOTE — Patient Instructions (Signed)
1. Continue your current medications. 2. As we discussed, you can use Colace 100 mg stool softener once a day.  You can take MiraLAX 1-2 times a day as needed for breakthrough constipation. 3. We will schedule your colonoscopy for you.  I have indicated you may be interested in hemorrhoid banding and can discuss further with Dr. Oneida Alar on the day of your procedure. 4. Further recommendations to be made. 5. Call us if you have any questions or concerns.  At [facility] we value your feedback. You may receive a survey about your visit today. Please share your experience as we strive to create trusting relationships with our patients to provide genuine, compassionate, quality care.  We appreciate your understanding and patience as we review any laboratory studies, imaging, and other diagnostic tests that are ordered as we care for you. Our office policy is 5 business days for review of these results, and any emergent or urgent results are addressed in a timely manner for your best interest. If you do not hear from our office in 1 week, please contact us.   We also encourage the use of MyChart, which contains your medical information for your review as well. If you are not enrolled in this feature, an access code is on this after visit summary for your convenience. Thank you for allowing [place] to be involved in your care.  It was great to see you today!  I hope you have a Happy Thanksgiving!!

## 2018-10-09 NOTE — Assessment & Plan Note (Signed)
The patient complains of hemorrhoid symptoms.  No significant hematochezia.  However, she is interested in hemorrhoid banding during her colonoscopy if possible.  Colonoscopy as per below, consider hemorrhoid banding.  The patient will further discuss with Dr. Oneida Alar on the day of her procedure.  Return for follow-up based on post procedure recommendations.

## 2018-10-09 NOTE — Progress Notes (Signed)
Primary Care Physician:  Asencion Noble, MD Primary Gastroenterologist:  Dr. Oneida Alar  Chief Complaint  Patient presents with  . Consult    TCS. Never had one prior. No family history    HPI:   Maureen Mahoney is a 52 y.o. female who presents on referral from primary care for colonoscopy.  Nurse triage was deferred to office visit due to medications.  Reviewed information provided with the referral including office visit dated 08/12/2018.  At that time it was noted she had recently undergone breast surgery with expanders in place and taking tamoxifen for breast cancer..  No history of colonoscopy in our system.  Today she states she's doing well overall. Just had breast expanders 08/30/18 and is well post-op. Denies abdominal pain, N/V, hematochezia, melena, fever, chills, unintentional weight loss. Does have intermittent hemorrhoid problems. Is interested in hemorrhoid banding if possible. Has intermittent constipation and will use MiraLAX 1-2 times a day. Denies chest pain, dyspnea, dizziness, lightheadedness, syncope, near syncope. Denies any other upper or lower GI symptoms.  She rarely takes Xanax; hasn't had it in several months.  Past Medical History:  Diagnosis Date  . Anxiety   . Diabetes mellitus without complication ([place])   . 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 Family history of breast cancer   . Family history of lung cancer   . Family history of melanoma   . Irritable bowel disease     Past Surgical History:  Procedure Laterality Date  . BREAST RECONSTRUCTION WITH PLACEMENT OF TISSUE EXPANDER AND ALLODERM Bilateral 05/14/2018   Procedure: BILATERAL BREAST RECONSTRUCTION WITH PLACEMENT OF TISSUE EXPANDER AND ACELLULAR DERMIS;  Surgeon: Irene Limbo, MD;  Location: [facility];  Service: Plastics;  Laterality: Bilateral;  . LIPOSUCTION WITH LIPOFILLING Bilateral  08/30/2018   Procedure: LIPOFILLING FROM ABDOMEN TO BILATERAL CHEST;  Surgeon: Irene Limbo, MD;  Location: [facility];  Service: Plastics;  Laterality: Bilateral;  . MASTECTOMY W/ SENTINEL NODE BIOPSY Bilateral 05/14/2018   Procedure: BILATERAL TOTAL MASTECTOMIES WITH BILATERAL SENTINEL LYMPH NODE BIOPSIES;  Surgeon: Rolm Bookbinder, MD;  Location: [facility];  Service: General;  Laterality: Bilateral;  . REMOVAL OF BILATERAL TISSUE EXPANDERS WITH PLACEMENT OF BILATERAL BREAST IMPLANTS Bilateral 08/30/2018   Procedure: REMOVAL OF BILATERAL TISSUE EXPANDERS WITH PLACEMENT OF BILATERAL BREAST IMPLANTS;  Surgeon: Irene Limbo, MD;  Location: [facility];  Service: Plastics;  Laterality: Bilateral;  . TISSUE EXPANDER PLACEMENT Left 06/14/2018   Procedure: removal and replacement left chest tissue expander;  Surgeon: Irene Limbo, MD;  Location: [facility];  Service: Plastics;  Laterality: Left;    Current Outpatient Medications  Medication Sig Dispense Refill  . aspirin 81 MG chewable tablet Chew by mouth daily.    Marland Kitchen atorvastatin (LIPITOR) 10 MG tablet Take 10 mg by mouth daily.    . cetirizine (ZYRTEC) 5 MG tablet Take 10 mg by mouth daily.     Marland Kitchen escitalopram (LEXAPRO) 10 MG tablet Take 10 mg by mouth daily.    . fluticasone (FLONASE) 50 MCG/ACT nasal spray Place into both nostrils as needed.     . metFORMIN (GLUCOPHAGE) 500 MG tablet Take 500 mg by mouth 2 (two) times daily with a meal.     . methocarbamol (ROBAXIN) 500 MG tablet Take 1 tablet (500 mg total) by mouth every 8 (eight) hours as needed for muscle spasms.  30 tablet 0  . Multiple Vitamins-Minerals (MULTIVITAMIN WITH MINERALS) tablet Take 1 tablet by mouth daily.    . polyethylene glycol powder (MIRALAX) powder Take 1 Container by mouth once.    . tamoxifen (NOLVADEX) 20 MG tablet Take 1 tablet (20 mg total) by mouth daily. 90 tablet 3  . ALPRAZolam (XANAX) 0.25  MG tablet Take 0.25 mg by mouth at bedtime as needed for anxiety.     No current facility-administered medications for this visit.     Allergies as of 10/09/2018 - Review Complete 10/09/2018  Allergen Reaction Noted  . Penicillins Other (See Comments) 06/26/2016    Family History  Problem Relation Age of Onset  . Lung cancer Mother 44  . Heart disease Maternal Uncle   . Heart disease Paternal Aunt   . Other Maternal Grandmother        pituitary tumor, not cancer  . Parkinson's disease Maternal Grandfather   . Breast cancer Other 80  . Breast cancer Other 50  . Breast cancer Other 80  . Lymphoma Other   . Colon cancer Neg Hx   . Gastric cancer Neg Hx   . Esophageal cancer Neg Hx     Social History   Socioeconomic History  . Marital status: Divorced    Spouse name: Not on file  . Number of children: Not on file  . Years of education: Not on file  . Highest education level: Not on file  Occupational History  . Not on file  Social Needs  . Financial resource strain: Not on file  . Food insecurity:    Worry: Not on file    Inability: Not on file  . Transportation needs:    Medical: Not on file    Non-medical: Not on file  Tobacco Use  . Smoking status: Never Smoker  . Smokeless tobacco: Never Used  Substance and Sexual Activity  . Alcohol use: Yes    Comment: occass  . Drug use: Never  . Sexual activity: Not Currently  Lifestyle  . Physical activity:    Days per week: Not on file    Minutes per session: Not on file  . Stress: Not on file  Relationships  . Social connections:    Talks on phone: Not on file    Gets together: Not on file    Attends religious service: Not on file    Active member of club or organization: Not on file    Attends meetings of clubs or organizations: Not on file    Relationship status: Not on file  . Intimate partner violence:    Fear of current or ex partner: Not on file    Emotionally abused: Not on file    Physically abused:  Not on file    Forced sexual activity: Not on file  Other Topics Concern  . Not on file  Social History Narrative  . Not on file    Review of Systems: Complete ROS negative except as per HPI.    Physical Exam: BP 122/76   Pulse 81   Temp (!) 96.8 F (36 C) (Oral)   Ht 5\' 5"  (1.651 m)   Wt 199 lb 9.6 oz (90.5 kg)   LMP 05/10/2018   BMI 33.22 kg/m  General:   Alert and oriented. Pleasant and cooperative. Well-nourished and well-developed.  Eyes:  Without icterus, sclera clear and conjunctiva pink.  Ears:  Normal auditory acuity. Cardiovascular:  S1, S2 present without murmurs appreciated. Extremities without clubbing or  edema. Respiratory:  Clear to auscultation bilaterally. No wheezes, rales, or rhonchi. No distress.  Gastrointestinal:  +BS, soft, non-tender and non-distended. No HSM noted. No guarding or rebound. No masses appreciated.  Rectal:  Deferred  Musculoskalatal:  Symmetrical without gross deformities. Neurologic:  Alert and oriented x4;  grossly normal neurologically. Psych:  Alert and cooperative. Normal mood and affect. Heme/Lymph/Immune: No excessive bruising noted.    10/09/2018 9:50 AM   Disclaimer: This note was dictated with voice recognition software. Similar sounding words can inadvertently be transcribed and may not be corrected upon review.

## 2018-10-09 NOTE — Telephone Encounter (Signed)
LVM for patient reminding of SCP visit with NP on 10/14/18 at 10 am.  Center number left for callback.

## 2018-10-09 NOTE — Assessment & Plan Note (Signed)
The patient is currently due for first ever colonoscopy.  She had to put this off this year because of her diagnosis of bilateral breast cancer.  Office visit was scheduled due to medications.  She is on low-dose Lexapro 10 mg daily.  However, she is no longer significantly taking Xanax.  She has not had any in several months.  I feel conscious sedation should be adequate for her procedure at this time.  Follow-up based on recommendations made after the colonoscopy.  Additionally, as per above, the patient is potentially interested in hemorrhoid banding, if possible, during her colonoscopy.  Proceed with colonoscopy +/- hemorrhoid banding with Dr. Oneida Alar in the near future. The risks, benefits, and alternatives have been discussed in detail with the patient. They state understanding and desire to proceed.   The patient is currently on Lexapro.  No other anticoagulants, anxiolytics, chronic pain medications, or antidepressants.  Conscious sedation should be adequate for her procedure.

## 2018-10-09 NOTE — Progress Notes (Signed)
cc'd to pcp 

## 2018-10-09 NOTE — Patient Instructions (Signed)
EG advised for pt to hold Metformin morning of TCS. Noted on instructions.

## 2018-10-14 ENCOUNTER — Encounter: Payer: Self-pay | Admitting: Adult Health

## 2018-10-14 ENCOUNTER — Inpatient Hospital Stay: Payer: BC Managed Care – PPO | Attending: Adult Health | Admitting: Adult Health

## 2018-10-14 ENCOUNTER — Telehealth: Payer: Self-pay | Admitting: Hematology and Oncology

## 2018-10-14 VITALS — BP 133/79 | HR 75 | Temp 98.5°F | Resp 19 | Ht 65.0 in | Wt 198.6 lb

## 2018-10-14 DIAGNOSIS — Z170 Estrogen receptor positive status [ER+]: Secondary | ICD-10-CM | POA: Insufficient documentation

## 2018-10-14 DIAGNOSIS — Z7984 Long term (current) use of oral hypoglycemic drugs: Secondary | ICD-10-CM | POA: Diagnosis not present

## 2018-10-14 DIAGNOSIS — Z79899 Other long term (current) drug therapy: Secondary | ICD-10-CM | POA: Insufficient documentation

## 2018-10-14 DIAGNOSIS — Z79810 Long term (current) use of selective estrogen receptor modulators (SERMs): Secondary | ICD-10-CM | POA: Diagnosis not present

## 2018-10-14 DIAGNOSIS — E119 Type 2 diabetes mellitus without complications: Secondary | ICD-10-CM | POA: Insufficient documentation

## 2018-10-14 DIAGNOSIS — Z7982 Long term (current) use of aspirin: Secondary | ICD-10-CM | POA: Insufficient documentation

## 2018-10-14 DIAGNOSIS — C50412 Malignant neoplasm of upper-outer quadrant of left female breast: Secondary | ICD-10-CM | POA: Diagnosis not present

## 2018-10-14 DIAGNOSIS — Z9013 Acquired absence of bilateral breasts and nipples: Secondary | ICD-10-CM | POA: Insufficient documentation

## 2018-10-14 NOTE — Progress Notes (Signed)
CLINIC:  Survivorship   REASON FOR VISIT:  Routine follow-up post-treatment for a recent history of breast cancer.  BRIEF ONCOLOGIC HISTORY:    Malignant neoplasm of upper-outer quadrant of left breast in female, estrogen receptor positive ([place])   04/01/2018 Genetic Testing    Genetic testing was negative.  Genes tested include: APC, ATM, AXIN2, BAP1, BARD1, BMPR1A, BRCA1, BRCA2, BRIP1, CDH1, CDK4, CDKN2A (p14ARF), CDKN2A (p16INK4a), CHEK2, CTNNA1, DICER1, EPCAM*, GREM1*, KIT, MEN1, MLH1, MSH2, MSH3, MSH6, MUTYH, NBN, NF1, PALB2, PDGFRA, PMS2, POLD1, POLE, POT1, PTEN, RAD50, RAD51C, RAD51D, RB1, SDHB, SDHC, SDHD, SMAD4, SMARCA4, STK11, TP53, TSC1, TSC2, VHL. The following genes were evaluated for sequence changes only: HOXB13*, MITF*, NTHL1*, SDHA.    05/14/2018 Surgery    Bilateral mastectomies: Left mastectomy: IDC with papillary features grade 2, 1.7 cm, intermediate grade DCIS, LCIS, margins negative, 0/2 lymph nodes negative, ER 100%, PR 100%, Ki-67 10%, HER-2 negative ratio 1.21, T1CN0 stage Ia; Right mastectomy: DCIS intermediate grade 1.2 cm, margins negative, 0/3 lymph nodes negative, ER 100%, PR 80%, Tis NX stage 0    05/31/2018 Oncotype testing    Oncotype recurrence score: 0, 3% risk of distant recurrence of 9 years    06/2018 -  Anti-estrogen oral therapy    Tamoxifen daily      INTERVAL HISTORY:  Maureen Mahoney presents to the [facility] today for our initial meeting to review her survivorship care plan detailing her treatment course for breast cancer, as well as monitoring long-term side effects of that treatment, education regarding health maintenance, screening, and overall wellness and health promotion.     Overall, Maureen Mahoney reports feeling moderately well.  She notes she was on her menstrual cycle at her last visit.  She says she was going to take Anastrozole, however, was menstruating, and therefore was set to take Tamoxifen.  She says her LMP was in  05/2018.  She does have some bowel issues.  She notes some occasional night time hot flashes, but is otherwise doing well.    She completed her breast reconstruction on 10/18 and continues to f/u with Dr. Iran Planas.      REVIEW OF SYSTEMS:  Review of Systems  Constitutional: Negative for appetite change, chills, fatigue and fever.  HENT:   Negative for hearing loss, lump/mass and trouble swallowing.   Eyes: Negative for eye problems and icterus.  Respiratory: Negative for chest tightness, cough and shortness of breath.   Cardiovascular: Negative for chest pain, leg swelling and palpitations.  Gastrointestinal: Negative for abdominal distention, abdominal pain, constipation, diarrhea, nausea and vomiting.  Endocrine: Positive for hot flashes.  Genitourinary: Negative for menstrual problem and vaginal bleeding.   Musculoskeletal: Negative for arthralgias.  Skin: Negative for itching and rash.  Neurological: Negative for dizziness, extremity weakness, headaches and numbness.  Hematological: Negative for adenopathy. Does not bruise/bleed easily.  Psychiatric/Behavioral: Negative for depression. The patient is not nervous/anxious.   Breast: Denies any new nodularity, masses, tenderness, nipple changes, or nipple discharge.      ONCOLOGY TREATMENT TEAM:  1. Surgeon:  Dr. Donne Hazel at [facility] 2. Medical Oncologist: Dr. Lindi Adie    PAST MEDICAL/SURGICAL HISTORY:  Past Medical History:  Diagnosis Date  . Anxiety   . Diabetes mellitus without complication ([place])   . 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 Family history of breast cancer   . Family history of lung cancer   .  Family history of melanoma   . Irritable bowel disease    Past Surgical History:  Procedure Laterality Date  . BREAST RECONSTRUCTION WITH PLACEMENT OF TISSUE EXPANDER AND ALLODERM Bilateral 05/14/2018   Procedure:  BILATERAL BREAST RECONSTRUCTION WITH PLACEMENT OF TISSUE EXPANDER AND ACELLULAR DERMIS;  Surgeon: Irene Limbo, MD;  Location: [facility];  Service: Plastics;  Laterality: Bilateral;  . LIPOSUCTION WITH LIPOFILLING Bilateral 08/30/2018   Procedure: LIPOFILLING FROM ABDOMEN TO BILATERAL CHEST;  Surgeon: Irene Limbo, MD;  Location: [facility];  Service: Plastics;  Laterality: Bilateral;  . MASTECTOMY W/ SENTINEL NODE BIOPSY Bilateral 05/14/2018   Procedure: BILATERAL TOTAL MASTECTOMIES WITH BILATERAL SENTINEL LYMPH NODE BIOPSIES;  Surgeon: Rolm Bookbinder, MD;  Location: [facility];  Service: General;  Laterality: Bilateral;  . REMOVAL OF BILATERAL TISSUE EXPANDERS WITH PLACEMENT OF BILATERAL BREAST IMPLANTS Bilateral 08/30/2018   Procedure: REMOVAL OF BILATERAL TISSUE EXPANDERS WITH PLACEMENT OF BILATERAL BREAST IMPLANTS;  Surgeon: Irene Limbo, MD;  Location: [facility];  Service: Plastics;  Laterality: Bilateral;  . TISSUE EXPANDER PLACEMENT Left 06/14/2018   Procedure: removal and replacement left chest tissue expander;  Surgeon: Irene Limbo, MD;  Location: [facility];  Service: Plastics;  Laterality: Left;     ALLERGIES:  Allergies  Allergen Reactions  . Penicillins Other (See Comments)    Pt's mother is allergic, has not tried it     CURRENT MEDICATIONS:  Outpatient Encounter Medications as of 10/14/2018  Medication Sig  . ALPRAZolam (XANAX) 0.25 MG tablet Take 0.25 mg by mouth at bedtime as needed for anxiety.  Marland Kitchen aspirin 81 MG chewable tablet Chew by mouth daily.  Marland Kitchen atorvastatin (LIPITOR) 10 MG tablet Take 10 mg by mouth daily.  . cetirizine (ZYRTEC) 5 MG tablet Take 10 mg by mouth daily.   Marland Kitchen escitalopram (LEXAPRO) 10 MG tablet Take 10 mg by mouth daily.  . fluticasone (FLONASE) 50 MCG/ACT nasal spray Place into both nostrils as needed.   . metFORMIN (GLUCOPHAGE) 500 MG tablet Take 500 mg  by mouth 2 (two) times daily with a meal.   . methocarbamol (ROBAXIN) 500 MG tablet Take 1 tablet (500 mg total) by mouth every 8 (eight) hours as needed for muscle spasms.  . Multiple Vitamins-Minerals (MULTIVITAMIN WITH MINERALS) tablet Take 1 tablet by mouth daily.  . Na Sulfate-K Sulfate-Mg Sulf (SUPREP BOWEL PREP KIT) 17.5-3.13-1.6 GM/177ML SOLN Take 1 kit by mouth as directed.  . polyethylene glycol powder (MIRALAX) powder Take 1 Container by mouth once.  . tamoxifen (NOLVADEX) 20 MG tablet Take 1 tablet (20 mg total) by mouth daily.   No facility-administered encounter medications on file as of 10/14/2018.      ONCOLOGIC FAMILY HISTORY:  Family History  Problem Relation Age of Onset  . Lung cancer Mother 54  . Heart disease Maternal Uncle   . Heart disease Paternal Aunt   . Other Maternal Grandmother        pituitary tumor, not cancer  . Parkinson's disease Maternal Grandfather   . Breast cancer Other 80  . Breast cancer Other 50  . Breast cancer Other 80  . Lymphoma Other   . Colon cancer Neg Hx   . Gastric cancer Neg Hx   . Esophageal cancer Neg Hx      GENETIC COUNSELING/TESTING: See above  SOCIAL HISTORY:  Social History   Socioeconomic History  . Marital status: Divorced    Spouse name: Not on file  . Number  of children: Not on file  . Years of education: Not on file  . Highest education level: Not on file  Occupational History  . Not on file  Social Needs  . Financial resource strain: Not on file  . Food insecurity:    Worry: Not on file    Inability: Not on file  . Transportation needs:    Medical: Not on file    Non-medical: Not on file  Tobacco Use  . Smoking status: Never Smoker  . Smokeless tobacco: Never Used  Substance and Sexual Activity  . Alcohol use: Yes    Comment: occass  . Drug use: Never  . Sexual activity: Not Currently  Lifestyle  . Physical activity:    Days per week: Not on file    Minutes per session: Not on file  .  Stress: Not on file  Relationships  . Social connections:    Talks on phone: Not on file    Gets together: Not on file    Attends religious service: Not on file    Active member of club or organization: Not on file    Attends meetings of clubs or organizations: Not on file    Relationship status: Not on file  . Intimate partner violence:    Fear of current or ex partner: Not on file    Emotionally abused: Not on file    Physically abused: Not on file    Forced sexual activity: Not on file  Other Topics Concern  . Not on file  Social History Narrative  . Not on file      PHYSICAL EXAMINATION:  Vital Signs:   Vitals:   10/14/18 1038  BP: 133/79  Pulse: 75  Resp: 19  Temp: 98.5 F (36.9 C)  SpO2: 100%   Filed Weights   10/14/18 1038  Weight: 198 lb 9.6 oz (90.1 kg)   General: Well-nourished, well-appearing female in no acute distress.  She is unaccompanied today.   HEENT: Head is normocephalic.  Pupils equal and reactive to light. Conjunctivae clear without exudate.  Sclerae anicteric. Oral mucosa is pink, moist.  Oropharynx is pink without lesions or erythema.  Lymph: No cervical, supraclavicular, or infraclavicular lymphadenopathy noted on palpation.  Cardiovascular: Regular rate and rhythm.Marland Kitchen Respiratory: Clear to auscultation bilaterally. Chest expansion symmetric; breathing non-labored.  Breasts: s/p bilateral mastectomies with reconstruction.  No sign of local recurrence. GI: Abdomen soft and round; non-tender, non-distended. Bowel sounds normoactive.  GU: Deferred.  Neuro: No focal deficits. Steady gait.  Psych: Mood and affect normal and appropriate for situation.  Extremities: No edema. MSK: No focal spinal tenderness to palpation.  Full range of motion in bilateral upper extremities Skin: Warm and dry.  LABORATORY DATA:  None for this visit.  DIAGNOSTIC IMAGING:  None for this visit.      ASSESSMENT AND PLAN:  Maureen Mahoney is a pleasant 52 y.o.  female with bilateral breast cancer, stage IA ER/PR positive in the left, and stage 0 in the right. She was diagnosed in 02/2018, treated with lumpectomy, adjuvant radiation therapy, and anti-estrogen therapy with Tamoxifen beginning in 07/2018.  She presents to the Survivorship Clinic for our initial meeting and routine follow-up post-completion of treatment for breast cancer.    1. Bilateral breast cancer:  Maureen Mahoney is continuing to recover from definitive treatment for breast cancer. She will follow-up with her medical oncologist, Dr. Lindi Adie in 6 months with history and physical exam per surveillance protocol.  She will continue her  anti-estrogen therapy with Tamoxifen. Thus far, she is tolerating the Tamoxifen well, with minimal side effects. She was instructed to make Dr. Lindi Adie or myself aware if she begins to experience any worsening side effects of the medication and I could see her back in clinic to help manage those side effects, as needed. Today, a comprehensive survivorship care plan and treatment summary was reviewed with the patient today detailing her breast cancer diagnosis, treatment course, potential late/long-term effects of treatment, appropriate follow-up care with recommendations for the future, and patient education resources.  A copy of this summary, along with a letter will be sent to the patient's primary care provider via mail/fax/In Basket message after today's visit.    2. Bone health:  Given Maureen Mahoney's age/history of breast cancer, she is at slight risk for bone demineralization.  I counseled her that Tamoxifen has a protective effect on the bones, however if she does change to Anastrozole in the future we will need to get a baseline DEXA and repeat it every 2 years.  She was given education on specific activities to promote bone health.  3. Cancer screening:  Due to Maureen Mahoney's history and her age, she should receive screening for skin cancers, colon cancer, and  gynecologic cancers.  The information and recommendations are listed on the patient's comprehensive care plan/treatment summary and were reviewed in detail with the patient.    4. Health maintenance and wellness promotion: Maureen Mahoney was encouraged to consume 5-7 servings of fruits and vegetables per day. We reviewed the "Nutrition Rainbow" handout, as well as the handout "Take Control of Your Health and Reduce Your Cancer Risk" from the [employer].  She was also encouraged to engage in moderate to vigorous exercise for 30 minutes per day most days of the week. We discussed the [employer] fitness program, which is designed for cancer survivors to help them become more physically fit after cancer treatments.  She was instructed to limit her alcohol consumption and continue to abstain from tobacco use.     5. Support services/counseling: It is not uncommon for this period of the patient's cancer care trajectory to be one of many emotions and stressors.  We discussed an opportunity for her to participate in the next session of [facility] ("Finding Your New Normal") support group series designed for patients after they have completed treatment.   Maureen Mahoney was encouraged to take advantage of our many other support services programs, support groups, and/or counseling in coping with her new life as a cancer survivor after completing anti-cancer treatment.  She was offered support today through active listening and expressive supportive counseling.  She was given information regarding our available services and encouraged to contact me with any questions or for help enrolling in any of our support group/programs.    Dispo:   -Return to cancer center in 6 months for f/u with Dr. Lindi Adie -Follow up with surgery in one year -She is welcome to return back to the Survivorship Clinic at any time; no additional follow-up needed at this time.  -Consider referral back to survivorship as a long-term  survivor for continued surveillance  A total of (30) minutes of face-to-face time was spent with this patient with greater than 50% of that time in counseling and care-coordination.   Gardenia Phlegm, NP Survivorship Program [facility] [phone]   Note: PRIMARY CARE PROVIDER Asencion Noble, [place] [phone]

## 2018-10-14 NOTE — Telephone Encounter (Signed)
Gave patient avs.  Patient did not need calendar.  °

## 2018-11-26 ENCOUNTER — Other Ambulatory Visit: Payer: Self-pay | Admitting: Plastic Surgery

## 2018-11-26 DIAGNOSIS — N63 Unspecified lump in unspecified breast: Secondary | ICD-10-CM

## 2018-12-03 ENCOUNTER — Other Ambulatory Visit: Payer: BC Managed Care – PPO

## 2018-12-09 ENCOUNTER — Encounter (HOSPITAL_COMMUNITY): Payer: Self-pay

## 2018-12-09 ENCOUNTER — Encounter (HOSPITAL_COMMUNITY)
Admission: RE | Disposition: A | Discharge status: Home / Self Care | Payer: Self-pay | Source: Home / Self Care | Attending: Gastroenterology

## 2018-12-09 ENCOUNTER — Other Ambulatory Visit: Payer: Self-pay

## 2018-12-09 ENCOUNTER — Ambulatory Visit (HOSPITAL_COMMUNITY)
Admission: RE | Admit: 2018-12-09 | Discharge: 2018-12-09 | Disposition: A | Discharge status: Home / Self Care | Payer: BC Managed Care – PPO | Attending: Gastroenterology | Admitting: Gastroenterology

## 2018-12-09 DIAGNOSIS — Z79899 Other long term (current) drug therapy: Secondary | ICD-10-CM | POA: Diagnosis not present

## 2018-12-09 DIAGNOSIS — Z8379 Family history of other diseases of the digestive system: Secondary | ICD-10-CM | POA: Insufficient documentation

## 2018-12-09 DIAGNOSIS — Z801 Family history of malignant neoplasm of trachea, bronchus and lung: Secondary | ICD-10-CM | POA: Diagnosis not present

## 2018-12-09 DIAGNOSIS — K644 Residual hemorrhoidal skin tags: Secondary | ICD-10-CM | POA: Diagnosis not present

## 2018-12-09 DIAGNOSIS — Z803 Family history of malignant neoplasm of breast: Secondary | ICD-10-CM | POA: Diagnosis not present

## 2018-12-09 DIAGNOSIS — Z7982 Long term (current) use of aspirin: Secondary | ICD-10-CM | POA: Diagnosis not present

## 2018-12-09 DIAGNOSIS — Z853 Personal history of malignant neoplasm of breast: Secondary | ICD-10-CM | POA: Diagnosis not present

## 2018-12-09 DIAGNOSIS — Z9013 Acquired absence of bilateral breasts and nipples: Secondary | ICD-10-CM | POA: Diagnosis not present

## 2018-12-09 DIAGNOSIS — Z8249 Family history of ischemic heart disease and other diseases of the circulatory system: Secondary | ICD-10-CM | POA: Insufficient documentation

## 2018-12-09 DIAGNOSIS — E119 Type 2 diabetes mellitus without complications: Secondary | ICD-10-CM | POA: Diagnosis not present

## 2018-12-09 DIAGNOSIS — Z808 Family history of malignant neoplasm of other organs or systems: Secondary | ICD-10-CM | POA: Insufficient documentation

## 2018-12-09 DIAGNOSIS — F419 Anxiety disorder, unspecified: Secondary | ICD-10-CM | POA: Diagnosis not present

## 2018-12-09 DIAGNOSIS — Q438 Other specified congenital malformations of intestine: Secondary | ICD-10-CM | POA: Diagnosis not present

## 2018-12-09 DIAGNOSIS — K649 Unspecified hemorrhoids: Secondary | ICD-10-CM

## 2018-12-09 DIAGNOSIS — Z7984 Long term (current) use of oral hypoglycemic drugs: Secondary | ICD-10-CM | POA: Insufficient documentation

## 2018-12-09 DIAGNOSIS — K648 Other hemorrhoids: Secondary | ICD-10-CM | POA: Insufficient documentation

## 2018-12-09 DIAGNOSIS — Z1211 Encounter for screening for malignant neoplasm of colon: Secondary | ICD-10-CM | POA: Diagnosis present

## 2018-12-09 DIAGNOSIS — D122 Benign neoplasm of ascending colon: Secondary | ICD-10-CM | POA: Insufficient documentation

## 2018-12-09 DIAGNOSIS — Z880 Allergy status to penicillin: Secondary | ICD-10-CM | POA: Diagnosis not present

## 2018-12-09 HISTORY — PX: POLYPECTOMY: SHX5525

## 2018-12-09 HISTORY — PX: COLONOSCOPY: SHX5424

## 2018-12-09 LAB — GLUCOSE, CAPILLARY: GLUCOSE-CAPILLARY: 102 mg/dL — AB (ref 70–99)

## 2018-12-09 SURGERY — COLONOSCOPY
Anesthesia: Moderate Sedation

## 2018-12-09 MED ORDER — STERILE WATER FOR IRRIGATION IR SOLN
Status: DC | PRN
  Administered 2018-12-09: 14:00:00

## 2018-12-09 MED ORDER — MEPERIDINE HCL 100 MG/ML IJ SOLN
INTRAMUSCULAR | Status: AC
  Filled 2018-12-09: qty 2

## 2018-12-09 MED ORDER — MEPERIDINE HCL 100 MG/ML IJ SOLN
INTRAMUSCULAR | Status: DC | PRN
  Administered 2018-12-09: 25 mg
  Administered 2018-12-09: 50 mg

## 2018-12-09 MED ORDER — MIDAZOLAM HCL 5 MG/5ML IJ SOLN
INTRAMUSCULAR | Status: AC
  Filled 2018-12-09: qty 10

## 2018-12-09 MED ORDER — SODIUM CHLORIDE 0.9 % IV SOLN
INTRAVENOUS | Status: DC
  Administered 2018-12-09: 13:00:00 via INTRAVENOUS

## 2018-12-09 MED ORDER — MIDAZOLAM HCL 5 MG/5ML IJ SOLN
INTRAMUSCULAR | Status: DC | PRN
  Administered 2018-12-09 (×2): 2 mg via INTRAVENOUS

## 2018-12-09 NOTE — H&P (Signed)
Primary Care Physician:  Asencion Noble, MD Primary Gastroenterologist:  Dr. Oneida Alar  Pre-Procedure History & Physical: HPI:  Maureen Mahoney is a 53 y.o. female here for [place].  Past Medical History:  Diagnosis Date  . Anxiety   . Diabetes mellitus without complication ([place])   . 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 Family history of breast cancer   . Family history of lung cancer   . Family history of melanoma   . Irritable bowel disease     Past Surgical History:  Procedure Laterality Date  . BREAST RECONSTRUCTION WITH PLACEMENT OF TISSUE EXPANDER AND ALLODERM Bilateral 05/14/2018   Procedure: BILATERAL BREAST RECONSTRUCTION WITH PLACEMENT OF TISSUE EXPANDER AND ACELLULAR DERMIS;  Surgeon: Irene Limbo, MD;  Location: [place];  Service: Plastics;  Laterality: Bilateral;  . LIPOSUCTION WITH LIPOFILLING Bilateral 08/30/2018   Procedure: LIPOFILLING FROM ABDOMEN TO BILATERAL CHEST;  Surgeon: Irene Limbo, MD;  Location: [place];  Service: Plastics;  Laterality: Bilateral;  . MASTECTOMY W/ SENTINEL NODE BIOPSY Bilateral 05/14/2018   Procedure: BILATERAL TOTAL MASTECTOMIES WITH BILATERAL SENTINEL LYMPH NODE BIOPSIES;  Surgeon: Rolm Bookbinder, MD;  Location: [place];  Service: General;  Laterality: Bilateral;  . REMOVAL OF BILATERAL TISSUE EXPANDERS WITH PLACEMENT OF BILATERAL BREAST IMPLANTS Bilateral 08/30/2018   Procedure: REMOVAL OF BILATERAL TISSUE EXPANDERS WITH PLACEMENT OF BILATERAL BREAST IMPLANTS;  Surgeon: Irene Limbo, MD;  Location: [place];  Service: Plastics;  Laterality: Bilateral;  . TISSUE EXPANDER PLACEMENT Left 06/14/2018   Procedure: removal and replacement left chest tissue expander;  Surgeon: Irene Limbo, MD;  Location: [place];  Service: Plastics;  Laterality:  Left;    Prior to Admission medications   Medication Sig Start Date End Date Taking? Authorizing Provider  aspirin 81 MG chewable tablet Chew 81 mg by mouth daily.    Yes [provider]  atorvastatin (LIPITOR) 10 MG tablet Take 10 mg by mouth daily.   Yes [provider]  cetirizine (ZYRTEC) 10 MG tablet Take 10 mg by mouth daily.    Yes [provider]  escitalopram (LEXAPRO) 10 MG tablet Take 10 mg by mouth daily.   Yes [provider]  metFORMIN (GLUCOPHAGE) 500 MG tablet Take 500 mg by mouth 2 (two) times daily with a meal.    Yes [provider]  Multiple Vitamins-Minerals (MULTIVITAMIN WITH MINERALS) tablet Take 1 tablet by mouth daily.   Yes [provider]  tamoxifen (NOLVADEX) 20 MG tablet Take 1 tablet (20 mg total) by mouth daily. 06/11/18  Yes Nicholas Lose, MD  fluticasone (FLONASE) 50 MCG/ACT nasal spray Place 2 sprays into both nostrils daily as needed for allergies.     [provider]  Na Sulfate-K Sulfate-Mg Sulf (SUPREP BOWEL PREP KIT) 17.5-3.13-1.6 GM/177ML SOLN Take 1 kit by mouth as directed. 10/09/18   Danie Binder, MD    Allergies as of 10/09/2018 - Review Complete 10/09/2018  Allergen Reaction Noted  . Penicillins Other (See Comments) 06/26/2016    Family History  Problem Relation Age of Onset  . Lung cancer Mother 63  . Heart disease Maternal Uncle   . Heart disease Paternal Aunt   . Other Maternal Grandmother        pituitary tumor, not cancer  . Parkinson's disease Maternal Grandfather   . Breast cancer Other 80  .  Breast cancer Other 50  . Breast cancer Other 80  . Lymphoma Other   . Colon cancer Neg Hx   . Gastric cancer Neg Hx   . Esophageal cancer Neg Hx     Social History   Socioeconomic History  . Marital status: Divorced    Spouse name: Not on file  . Number of children: Not on file  . Years of education: Not on file  . Highest education level: Not on file  Occupational  History  . Not on file  Social Needs  . Financial resource strain: Not on file  . Food insecurity:    Worry: Not on file    Inability: Not on file  . Transportation needs:    Medical: Not on file    Non-medical: Not on file  Tobacco Use  . Smoking status: Never Smoker  . Smokeless tobacco: Never Used  Substance and Sexual Activity  . Alcohol use: Yes    Comment: occass  . Drug use: Never  . Sexual activity: Not Currently  Lifestyle  . Physical activity:    Days per week: Not on file    Minutes per session: Not on file  . Stress: Not on file  Relationships  . Social connections:    Talks on phone: Not on file    Gets together: Not on file    Attends religious service: Not on file    Active member of club or organization: Not on file    Attends meetings of clubs or organizations: Not on file    Relationship status: Not on file  . Intimate partner violence:    Fear of current or ex partner: Not on file    Emotionally abused: Not on file    Physically abused: Not on file    Forced sexual activity: Not on file  Other Topics Concern  . Not on file  Social History Narrative  . Not on file    Review of Systems: See HPI, otherwise negative ROS   Physical Exam: BP 131/81   Pulse 66   Temp 97.7 F (36.5 C) (Oral)   Resp 16   Ht 5' 4"  (1.626 m)   Wt 89.8 kg   SpO2 98%   BMI 33.99 kg/m  General:   Alert,  pleasant and cooperative in NAD Head:  Normocephalic and atraumatic. Neck:  Supple; Lungs:  Clear throughout to auscultation.    Heart:  Regular rate and rhythm. Abdomen:  Soft, nontender and nondistended. Normal bowel sounds, without guarding, and without rebound.   Neurologic:  Alert and  oriented x4;  grossly normal neurologically.  Impression/Plan:    SCREENING  Plan:  1. TCS TODAY DISCUSSED PROCEDURE, BENEFITS, & RISKS: < 1% chance of medication reaction, bleeding, perforation, or rupture of spleen/liver.

## 2018-12-09 NOTE — Op Note (Signed)
[facility] Patient Name: Maureen Mahoney Procedure Date: 12/09/2018 1:15 PM MRN: [id number] Date of Birth: [birth year] Attending MD: Barney Drain MD, MD CSN: [id number] Age: 53 Admit Type: Outpatient Procedure:                Colonoscopy WITH COLD SNARE POLYPECTOMY Indications:              Screening for colorectal malignant neoplasm Providers:                Barney Drain MD, MD, Janeece Riggers, RN, Tammy Vaught,                            RN, Gerome Sam, RN Referring MD:             Asencion Noble Medicines:                Meperidine 75 mg IV, Midazolam 4 mg IV Complications:            No immediate complications. Estimated Blood Loss:     Estimated blood loss was minimal. Procedure:                Pre-Anesthesia Assessment:                           - Prior to the procedure, a History and Physical                            was performed, and patient medications and                            allergies were reviewed. The patient's tolerance of                            previous anesthesia was also reviewed. The risks                            and benefits of the procedure and the sedation                            options and risks were discussed with the patient.                            All questions were answered, and informed consent                            was obtained. Prior Anticoagulants: The patient has                            taken aspirin, last dose was 1 day prior to                            procedure. ASA Grade Assessment: II - A patient                            with mild systemic disease. After reviewing the  risks and benefits, the patient was deemed in                            satisfactory condition to undergo the procedure.                            After obtaining informed consent, the colonoscope                            was passed under direct vision. Throughout the                            procedure, the  patient's blood pressure, pulse, and                            oxygen saturations were monitored continuously. The                            [id number]-[id number] ([id number]) scope was introduced through                            the anus and advanced to the the cecum, identified                            by appendiceal orifice and ileocecal valve. The                            colonoscopy was technically difficult and complex                            due to a tortuous colon. Successful completion of                            the procedure was aided by straightening and                            shortening the scope to obtain bowel loop reduction                            and COLOWRAP. The patient tolerated the procedure                            fairly well. The quality of the bowel preparation                            was adequate to identify polyps 6 mm and larger in                            size. The ileocecal valve, appendiceal orifice, and                            rectum were photographed. Scope In: 1:51:22 PM Scope Out: 2:21:59 PM Scope Withdrawal Time: 0 hours 23  minutes 42 seconds  Total Procedure Duration: 0 hours 30 minutes 37 seconds  Findings:      MODERATE TO LARGE EXTERNAL Hemorrhoids were found on perianal exam.      A 6 mm polyp was found in the ascending colon. The polyp was sessile.       The polyp was removed with a cold snare. Resection and retrieval were       complete.      The recto-sigmoid colon, sigmoid colon and descending colon were       significantly tortuous.      Internal hemorrhoids were found. The hemorrhoids were small. Impression:               - LARGE EXTERNAL Hemorrhoids found on perianal exam.                           - One 6 mm polyp in the ascending colon, removed                            with a cold snare. Resected and retrieved.                           - EXTREMELY Tortuous colon.                           - SMALL Internal  hemorrhoids. Moderate Sedation:      Moderate (conscious) sedation was administered by the endoscopy nurse       and supervised by the endoscopist. The following parameters were       monitored: oxygen saturation, heart rate, blood pressure, and response       to care. Total physician intraservice time was 45 minutes. Recommendation:           - Patient has a contact number available for                            emergencies. The signs and symptoms of potential                            delayed complications were discussed with the                            patient. Return to normal activities tomorrow.                            Written discharge instructions were provided to the                            patient.                           - High fiber diet.                           - Continue present medications.                           - Await pathology results.                           -  Repeat colonoscopy for surveillance based on                            pathology results: 1 YEAR IF A SIMPLE ADENOMA AND 5                            YEARS IF A HYPERPLASTIC POLYP WAS REMOVED. Procedure Code(s):        --- Professional ---                           [phone], Colonoscopy, flexible; with removal of                            tumor(s), polyp(s), or other lesion(s) by snare                            technique                           99153, Moderate sedation; each additional 15                            minutes intraservice time                           99153, Moderate sedation; each additional 15                            minutes intraservice time                           G0500, Moderate sedation services provided by the                            same physician or other qualified health care                            professional performing a gastrointestinal                            endoscopic service that sedation supports,                            requiring the  presence of an independent trained                            observer to assist in the monitoring of the                            patient's level of consciousness and physiological                            status; initial 15 minutes of intra-service time;  patient age 36 years or older (additional time may                            be reported with [phone], as appropriate) Diagnosis Code(s):        --- Professional ---                           Z12.11, Encounter for screening for malignant                            neoplasm of colon                           K64.8, Other hemorrhoids                           D12.2, Benign neoplasm of ascending colon                           Q43.8, Other specified congenital malformations of                            intestine CPT copyright 2018 [employer]. All rights reserved. The codes documented in this report are preliminary and upon coder review may  be revised to meet current compliance requirements. Barney Drain, MD Barney Drain MD, MD 12/09/2018 2:38:11 PM This report has been signed electronically. Number of Addenda: 0

## 2018-12-11 ENCOUNTER — Ambulatory Visit
Admission: RE | Admit: 2018-12-11 | Discharge: 2018-12-11 | Disposition: A | Discharge status: Home / Self Care | Payer: BC Managed Care – PPO | Source: Ambulatory Visit | Attending: Plastic Surgery | Admitting: Plastic Surgery

## 2018-12-11 DIAGNOSIS — N63 Unspecified lump in unspecified breast: Secondary | ICD-10-CM

## 2018-12-12 ENCOUNTER — Encounter (HOSPITAL_COMMUNITY): Payer: Self-pay | Admitting: Gastroenterology

## 2018-12-13 NOTE — Discharge Instructions (Signed)
You had 1 small polyp removed. You have SMALL internal  AND MODERATE TO LARGE EXTERNAL hemorrhoids.   DRINK WATER TO KEEP YOUR URINE LIGHT YELLOW.  CONTINUE YOUR WEIGHT LOSS EFFORTS. YOUR BODY MASS INDEX IS OVER 30 WHICH MEANS YOU ARE OBESE. OBESITY IS ASSOCIATED WITH AN INCREASED FOR CIRRHOSIS AND ALL CANCERS, INCLUDING ESOPHAGEAL AND COLON CANCER. A WEIGHT OF 175 LBS OR LESS  WILL GET YOUR BODY MASS INDEX(BMI) UNDER 30.  FOLLOW A HIGH FIBER DIET. AVOID ITEMS THAT CAUSE BLOATING & GAS. SEE INFO BELOW.   USE PREPARATION H FOUR TIMES  A DAY IF NEEDED TO RELIEVE RECTAL PAIN/PRESSURE/BLEEDING. SEE SURGERY TO FIX YOUR HEMORRHOIDS IF  CREAMS AND SUPPOSITORIES DO NOT CONTROL YOUR HEMORRHOID SYMPTOMS.   YOUR BIOPSY RESULTS WILL BE BACK IN 5 BUSINESS DAYS.  Next colonoscopy in 1 YEAR IF A SIMPLE ADENOMA WAS REMOVED AND 5 YEARS IF A HYPERPLASTIC POLYP WAS REMOVED.    Colonoscopy Care After Read the instructions outlined below and refer to this sheet in the next week. These discharge instructions provide you with general information on caring for yourself after you leave the hospital. While your treatment has been planned according to the most current medical practices available, unavoidable complications occasionally occur. If you have any problems or questions after discharge, call DR. Khole Branch, [phone].  ACTIVITY  You may resume your regular activity, but move at a slower pace for the next 24 hours.   Take frequent rest periods for the next 24 hours.   Walking will help get rid of the air and reduce the bloated feeling in your belly (abdomen).   No driving for 24 hours (because of the medicine (anesthesia) used during the test).   You may shower.   Do not sign any important legal documents or operate any machinery for 24 hours (because of the anesthesia used during the test).    NUTRITION  Drink plenty of fluids.   You may resume your normal diet as instructed by your doctor.    Begin with a light meal and progress to your normal diet. Heavy or fried foods are harder to digest and may make you feel sick to your stomach (nauseated).   Avoid alcoholic beverages for 24 hours or as instructed.    MEDICATIONS  You may resume your normal medications.   WHAT YOU CAN EXPECT TODAY  Some feelings of bloating in the abdomen.   Passage of more gas than usual.   Spotting of blood in your stool or on the toilet paper  .  IF YOU HAD POLYPS REMOVED DURING THE COLONOSCOPY:  Eat a soft diet IF YOU HAVE NAUSEA, BLOATING, ABDOMINAL PAIN, OR VOMITING.    FINDING OUT THE RESULTS OF YOUR TEST Not all test results are available during your visit. DR. Oneida Alar WILL CALL YOU WITHIN 14 DAYS OF YOUR PROCEDUE WITH YOUR RESULTS. Do not assume everything is normal if you have not heard from DR. Yajayra Feldt, CALL HER OFFICE AT [phone].  SEEK IMMEDIATE MEDICAL ATTENTION AND CALL THE OFFICE: [phone] IF:  You have more than a spotting of blood in your stool.   Your belly is swollen (abdominal distention).   You are nauseated or vomiting.   You have a temperature over 101F.   You have abdominal pain or discomfort that is severe or gets worse throughout the day.   High-Fiber Diet A high-fiber diet changes your normal diet to include more whole grains, legumes, fruits, and vegetables. Changes in the diet involve replacing  refined carbohydrates with unrefined foods. The calorie level of the diet is essentially unchanged. The Dietary Reference Intake (recommended amount) for adult males is 38 grams per day. For adult females, it is 25 grams per day. Pregnant and lactating women should consume 28 grams of fiber per day. Fiber is the intact part of a plant that is not broken down during digestion. Functional fiber is fiber that has been isolated from the plant to provide a beneficial effect in the body.  PURPOSE  Increase stool bulk.   Ease and regulate bowel movements.    Lower cholesterol.   REDUCE RISK OF COLON CANCER  INDICATIONS THAT YOU NEED MORE FIBER  Constipation and hemorrhoids.   Uncomplicated diverticulosis (intestine condition) and irritable bowel syndrome.   Weight management.   As a protective measure against hardening of the arteries (atherosclerosis), diabetes, and cancer.   GUIDELINES FOR INCREASING FIBER IN THE DIET  Start adding fiber to the diet slowly. A gradual increase of about 5 more grams (2 slices of whole-wheat bread, 2 servings of most fruits or vegetables, or 1 bowl of high-fiber cereal) per day is best. Too rapid an increase in fiber may result in constipation, flatulence, and bloating.   Drink enough water and fluids to keep your urine clear or pale yellow. Water, juice, or caffeine-free drinks are recommended. Not drinking enough fluid may cause constipation.   Eat a variety of high-fiber foods rather than one type of fiber.   Try to increase your intake of fiber through using high-fiber foods rather than fiber pills or supplements that contain small amounts of fiber.   The goal is to change the types of food eaten. Do not supplement your present diet with high-fiber foods, but replace foods in your present diet.   INCLUDE A VARIETY OF FIBER SOURCES  Replace refined and processed grains with whole grains, canned fruits with fresh fruits, and incorporate other fiber sources. White rice, white breads, and most bakery goods contain little or no fiber.   Brown whole-grain rice, buckwheat oats, and many fruits and vegetables are all good sources of fiber. These include: broccoli, Brussels sprouts, cabbage, cauliflower, beets, sweet potatoes, white potatoes (skin on), carrots, tomatoes, eggplant, squash, berries, fresh fruits, and dried fruits.   Cereals appear to be the richest source of fiber. Cereal fiber is found in whole grains and bran. Bran is the fiber-rich outer coat of cereal grain, which is largely removed in  refining. In whole-grain cereals, the bran remains. In breakfast cereals, the largest amount of fiber is found in those with "bran" in their names. The fiber content is sometimes indicated on the label.   You may need to include additional fruits and vegetables each day.   In baking, for 1 cup white flour, you may use the following substitutions:   1 cup whole-wheat flour minus 2 tablespoons.   1/2 cup white flour plus 1/2 cup whole-wheat flour.   Polyps, Colon  A polyp is extra tissue that grows inside your body. Colon polyps grow in the large intestine. The large intestine, also called the colon, is part of your digestive system. It is a long, hollow tube at the end of your digestive tract where your body makes and stores stool. Most polyps are not dangerous. They are benign. This means they are not cancerous. But over time, some types of polyps can turn into cancer. Polyps that are smaller than a pea are usually not harmful. But larger polyps could someday become or  may already be cancerous. To be safe, doctors remove all polyps and test them.   WHO GETS POLYPS? Anyone can get polyps, but certain people are more likely than others. You may have a greater chance of getting polyps if:  You are over 50.   You have had polyps before.   Someone in your family has had polyps.   Someone in your family has had cancer of the large intestine.   Find out if someone in your family has had polyps. You may also be more likely to get polyps if you:   Eat a lot of fatty foods   Smoke   Drink alcohol   Do not exercise  Eat too much   PREVENTION There is not one sure way to prevent polyps. You might be able to lower your risk of getting them if you:  Eat more fruits and vegetables and less fatty food.   Do not smoke.   Avoid alcohol.   Exercise every day.   Lose weight if you are overweight.   Eating more calcium and folate can also lower your risk of getting polyps. Some foods that are  rich in calcium are milk, cheese, and broccoli. Some foods that are rich in folate are chickpeas, kidney beans, and spinach.   Hemorrhoids Hemorrhoids are dilated (enlarged) veins around the rectum. Sometimes clots will form in the veins. This makes them swollen and painful. These are called thrombosed hemorrhoids. Causes of hemorrhoids include:  Constipation.   Straining to have a bowel movement.   HEAVY LIFTING  HOME CARE INSTRUCTIONS  Eat a well balanced diet and drink 6 to 8 glasses of water every day to avoid constipation. You may also use a bulk laxative.   Avoid straining to have bowel movements.   Keep anal area dry and clean.   Do not use a donut shaped pillow or sit on the toilet for long periods. This increases blood pooling and pain.   Move your bowels when your body has the urge; this will require less straining and will decrease pain and pressure.

## 2018-12-16 NOTE — Progress Notes (Signed)
PT is aware.

## 2018-12-26 IMAGING — MR MR BILATERAL BREAST WITHOUT AND WITH CONTRAST
8 of 13 series · 30 of 48 positions shown · IV contrast (multihance)
Comparison: Previous exam(s).

CLINICAL DATA: Recent diagnosis of DCIS involving intraductal
papilloma of 2 left breast masses: At 2 o'clock breast, and slightly
upper outer quadrant subareolar breast, and DCIS of the right breast
upper outer quadrant.

LABS:  Creatinine 0.5, BUN 9, GFR 112.
EXAM:
BILATERAL BREAST MRI WITH AND WITHOUT CONTRAST
TECHNIQUE: Multiplanar, multisequence MR images of both breasts were obtained
prior to and following the intravenous administration of 18 ml of
MultiHance.

[Series 2: STIR · axial · 3.0mm · 0.94mm/px · 1 of 62 slices shown]
[im 1/62]
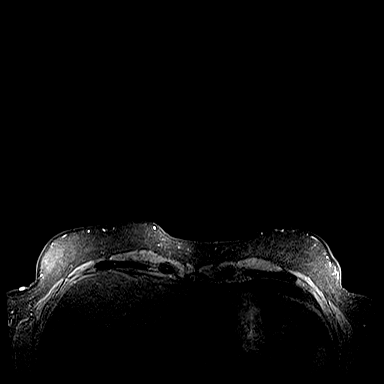

[Series 3: axial pre no · axial · non-contrast · 1.0mm · 0.86mm/px · z∈[-107,+132]mm · 5 of 240 slices shown]
[im 1/240]
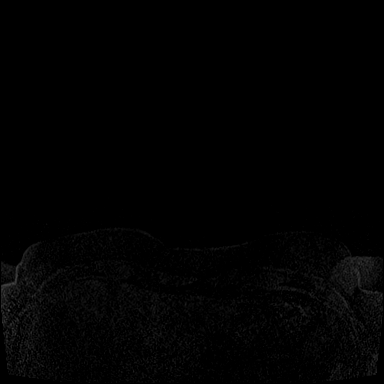
[im 60/240]
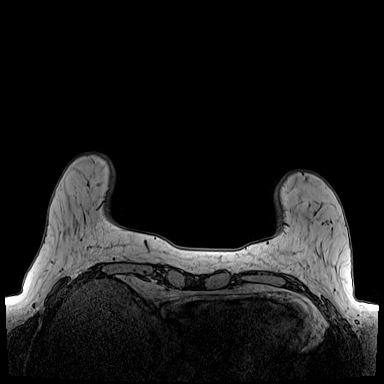
[im 120/240]
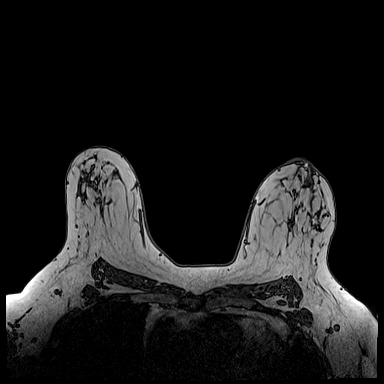
[im 180/240]
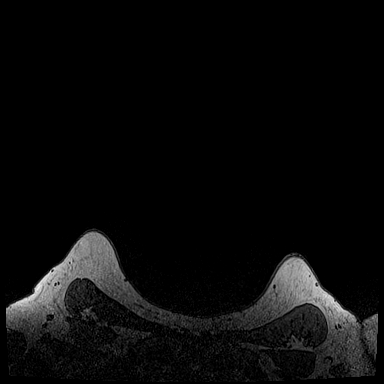
[im 240/240]
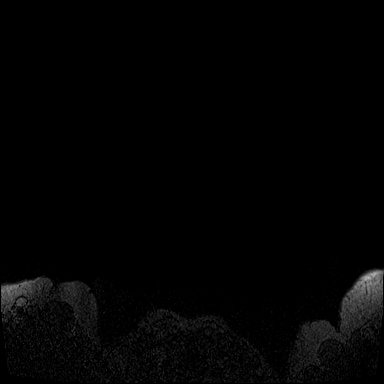

[Series 4: axial pre fs · axial · non-contrast · 1.0mm · 0.86mm/px · z∈[-107,+132]mm · 5 of 240 slices shown]
[im 1/240]
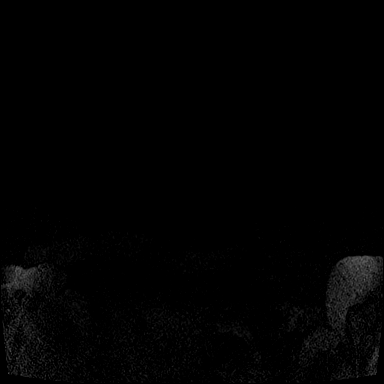
[im 60/240]
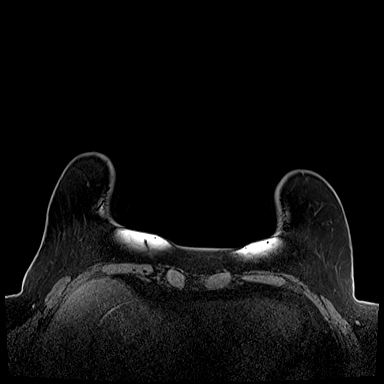
[im 120/240]
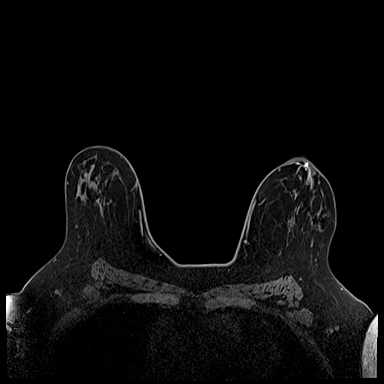
[im 180/240]
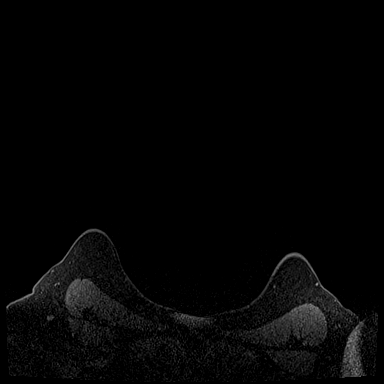
[im 240/240]
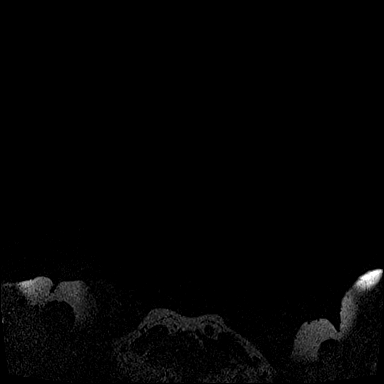

[Series 5: axial post 20 · axial · 1.0mm · 0.86mm/px · z∈[-107,+132]mm · 5 of 240 slices shown (1 of 3)]
[im 1/240]
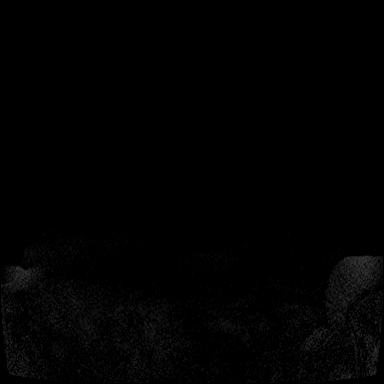
[im 60/240]
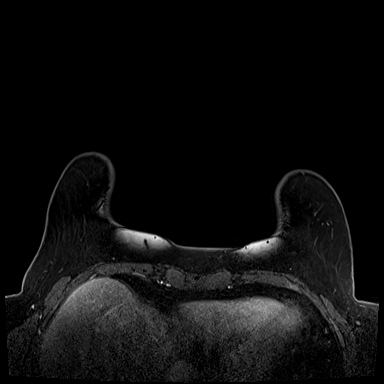
[im 120/240]
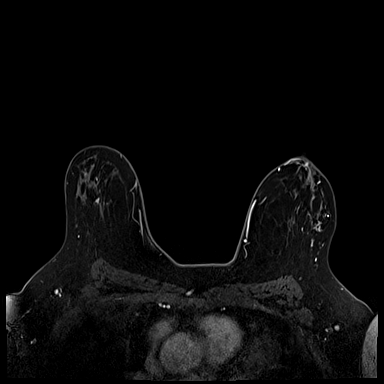
[im 180/240]
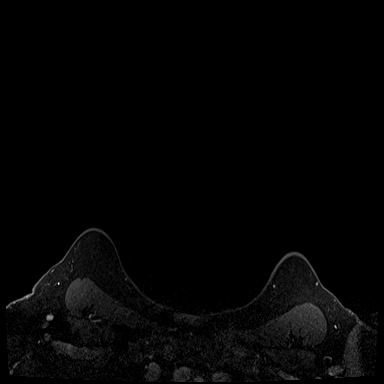
[im 240/240]
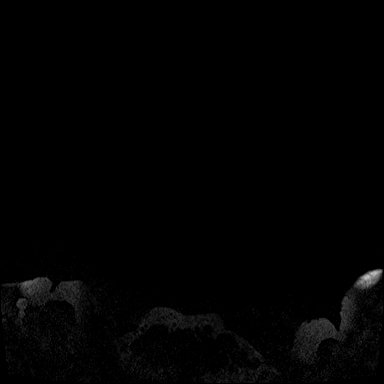

[Series 6: axial post 20 · axial · 1.0mm · 0.86mm/px · z∈[-107,+132]mm · 5 of 240 slices shown (2 of 3)]
[im 1/240]
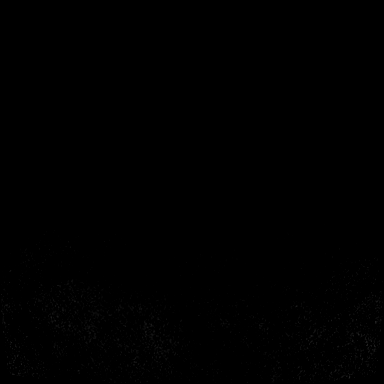
[im 60/240]
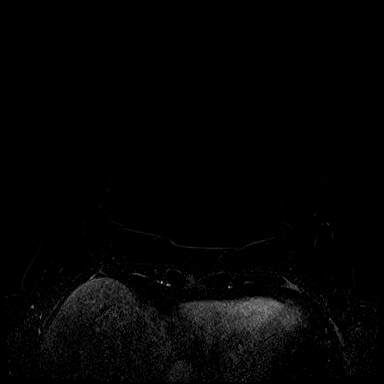
[im 120/240]
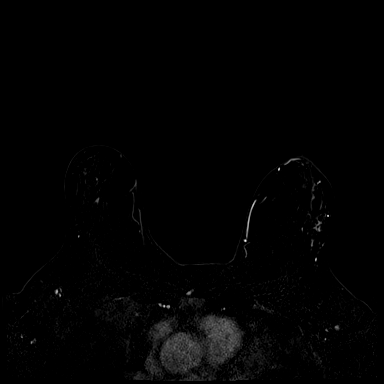
[im 180/240]
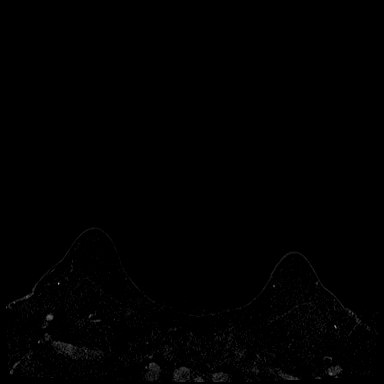
[im 240/240]
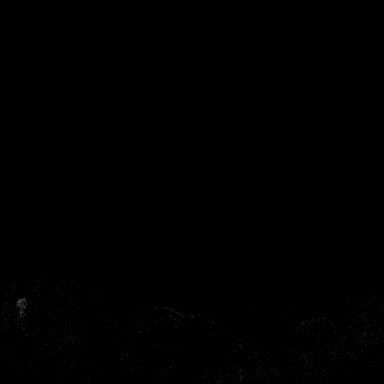

[Series 7: axial post 20 · axial · 240.0mm · 0.86mm/px · 1 of 1 slices shown (3 of 3)]
[im 1/1]
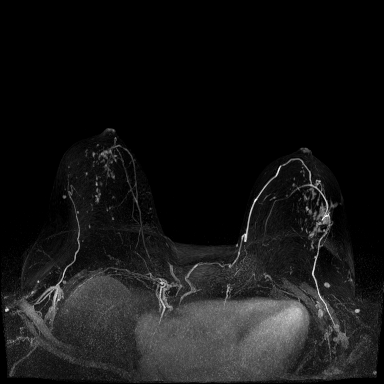

[Series 8: axial post 3 · axial · 1.0mm · 0.86mm/px · z∈[-107,+132]mm · 5 of 240 slices shown (1 of 2)]
[im 1/240]
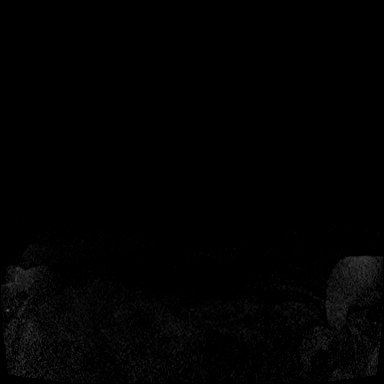
[im 60/240]
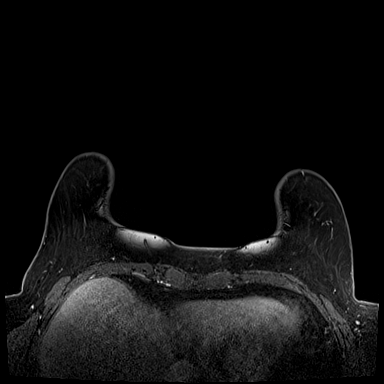
[im 120/240]
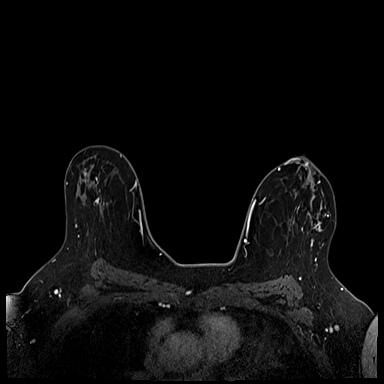
[im 180/240]
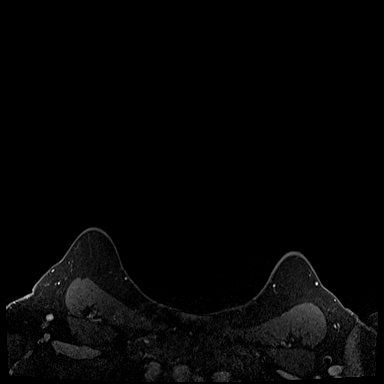
[im 240/240]
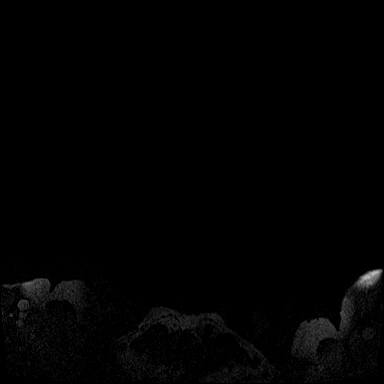

[Series 9: axial post 3 · axial · 1.0mm · 0.86mm/px · z∈[-107,-12]mm · 3 of 240 slices shown (2 of 2)]
[im 1/240]
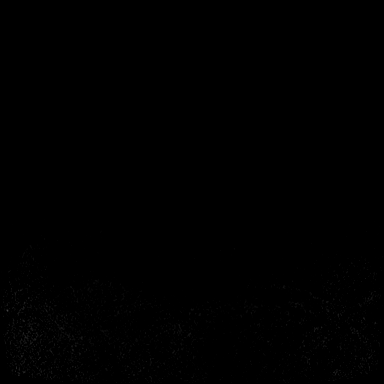
[im 48/240]
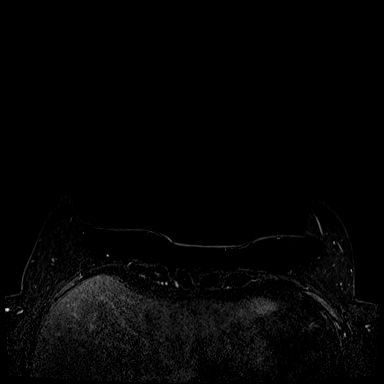
[im 96/240]
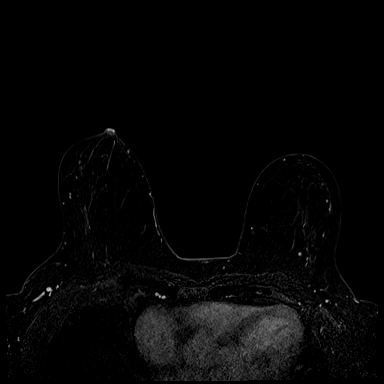

[30 of 48 positions shown; findings below may reference images not displayed]

THREE-DIMENSIONAL MR IMAGE RENDERING ON INDEPENDENT WORKSTATION:

Three-dimensional MR images were rendered by post-processing of the
original MR data on an independent workstation. The
three-dimensional MR images were interpreted, and findings are
reported in the following complete MRI report for this study. Three
dimensional images were evaluated at the independent [employer]
workstation
FINDINGS: Breast composition: b. Scattered fibroglandular tissue.

Background parenchymal enhancement: Moderate.

Right breast: There is an area of linear stippled non mass
enhancement in the right breast slightly upper outer quadrant,
middle depth, which measures 1.7 cm. A tissue marker signal void
artifact is seen in the inferior posterior portion of this area of
enhancement, corresponding to the biopsy-proven DCIS, image 117,
axial images. There is a separate area of non mass enhancement with
mixed predominantly progressive kinetics of enhancement in the right
breast lower inner quadrant, anterior depth, which measures 1.6 by
1.4 cm. A linear branching extension of this area of non mass
enhancement is seen inferiorly and posteriorly measuring additional
1.5 cm, image 167, axial images. Overall the anterior to posterior
extent of enhancement measures 3.2 cm.

Left breast: There is an avidly enhancing mass with predominantly
rapid washout kinetics of enhancement in the left outer central
breast, middle depth, which measures 1.6 by 1.6 by 1.2 cm and
contains a tissue marker signal void artifact. This finding
corresponds to the biopsy-proven DCIS within an intraductal
papilloma in the left 2 o'clock breast. There is additional non mass
enhancement which extends anteriorly and posterior to this mass in
predominantly superior distribution, in the upper outer quadrant,
which measures 2.7 cm in anterior to posterior dimension, image 123,
axial images. 3 mm nodular enhancement is seen in the left breast
slightly upper outer quadrant, anterior depth, associated with post
biopsy marker artifact, which corresponds to the second area of DCIS
within a papilloma, labeled subareolar on the recent diagnostic
workup. Additionally, there is a 6 mm progressively enhancing
circumscribed nodule in the left breast lower slightly outer
axial images.

Lymph nodes: No abnormal appearing lymph nodes.

Ancillary findings:  None.
IMPRESSION: Right breast:

Biopsy-proven right breast upper outer quadrant DCIS corresponds to
1.7 cm linear stippled enhancement.

Right breast lower inner quadrant, anterior depth, area of non mass
enhancement with suspicious appearance. MRI guided core needle
biopsy of the more posterior linear branching portion of this
abnormality is recommended.

Left breast:

Biopsy-proven left breast 2 o'clock DCIS presents as 1.6 cm avidly
enhancing mass, with surrounding non mass enhancement measuring
cm in the greatest anterior to posterior dimension.

Biopsy-proven left subareolar breast DCIS presents as 3 mm
progressively enhancing nodule.

6 mm progressively enhancing nodule in the left 5:30 o'clock breast,
posterior depth. Second-look ultrasound to plan for an imaging
guided biopsy is recommended for this finding. If not found on
ultrasound, then MRI guided core needle biopsy is recommended.

No evidence of axillary lymphadenopathy.

RECOMMENDATION:
MRI guided core needle biopsy of right breast lower inner quadrant
area of non mass enhancement.

Second-look ultrasound of left breast 5:30 o'clock 6 mm nodule. If
not found on ultrasound, then MRI guided core needle biopsy is
recommended for this nodule as well.

BI-RADS CATEGORY  4: Suspicious.

## 2019-03-10 ENCOUNTER — Other Ambulatory Visit: Payer: Self-pay

## 2019-03-10 ENCOUNTER — Other Ambulatory Visit (HOSPITAL_COMMUNITY): Payer: Self-pay | Admitting: Internal Medicine

## 2019-03-10 ENCOUNTER — Ambulatory Visit (HOSPITAL_COMMUNITY)
Admission: RE | Admit: 2019-03-10 | Discharge: 2019-03-10 | Disposition: A | Discharge status: Home / Self Care | Payer: BC Managed Care – PPO | Source: Ambulatory Visit | Attending: Internal Medicine | Admitting: Internal Medicine

## 2019-03-10 DIAGNOSIS — M25541 Pain in joints of right hand: Secondary | ICD-10-CM

## 2019-04-21 ENCOUNTER — Telehealth: Payer: Self-pay | Admitting: Hematology and Oncology

## 2019-04-21 NOTE — Assessment & Plan Note (Signed)
05/14/2018: Bilateral mastectomies:  Left mastectomy: IDC with papillary features grade 2, 1.7 cm, intermediate grade DCIS, LCIS, margins negative, 0/2 lymph nodes negative, ER 100%, PR 100%, Ki-67 10%, HER-2 negative ratio 1.21, T1CN0 stage Ia; Right mastectomy: DCIS intermediate grade 1.2 cm, margins negative, 0/3 lymph nodes negative, ER 100%, PR 80%, Tis NX stage 0 Oncotype DX testing on the left breast: Recurrence score 0: 3% risk of distant recurrence of 9 years  Current treatment: Tamoxifen 20 mg daily x5 years started 06/11/2018 Tamoxifen toxicities:  Breast cancer surveillance: No role of mammogram since she had bilateral mastectomies. Return to clinic in 1 year for follow-up.

## 2019-04-21 NOTE — Telephone Encounter (Signed)
Talk with patient regarding video visit

## 2019-04-26 NOTE — Progress Notes (Signed)
HEMATOLOGY-ONCOLOGY DOXIMITY VISIT PROGRESS NOTE  I connected with Maureen Mahoney on 04/28/2019 at 11:30 AM EDT by Doximity video conference and verified that I am speaking with the correct person using two identifiers.  I discussed the limitations, risks, security and privacy concerns of performing an evaluation and management service by Doximity and the availability of in person appointments.  I also discussed with the patient that there may be a patient responsible charge related to this service. The patient expressed understanding and agreed to proceed.  Patient's Location: Home Physician Location: Clinic  CHIEF COMPLIANT: Follow-up of tamoxifen  INTERVAL HISTORY: Maureen Mahoney is a 53 y.o. female with above-mentioned history of left breast cancer and right breast DCIS who underwent bilateral mastectomies with reconstruction and is currently on anti-estrogen therapy with tamoxifen. I last saw her a year ago and in the interim she was seen by Wilber Bihari, NP for [facility]. She presents over Doximity today for annual follow-up.  Covid testing done today because she had a low grade temp.  Oncology History  Malignant neoplasm of upper-outer quadrant of left breast in female, estrogen receptor positive ([place])  04/01/2018 Genetic Testing   Genetic testing was negative.  Genes tested include: APC, ATM, AXIN2, BAP1, BARD1, BMPR1A, BRCA1, BRCA2, BRIP1, CDH1, CDK4, CDKN2A (p14ARF), CDKN2A (p16INK4a), CHEK2, CTNNA1, DICER1, EPCAM*, GREM1*, KIT, MEN1, MLH1, MSH2, MSH3, MSH6, MUTYH, NBN, NF1, PALB2, PDGFRA, PMS2, POLD1, POLE, POT1, PTEN, RAD50, RAD51C, RAD51D, RB1, SDHB, SDHC, SDHD, SMAD4, SMARCA4, STK11, TP53, TSC1, TSC2, VHL. The following genes were evaluated for sequence changes only: HOXB13*, MITF*, NTHL1*, SDHA.   05/14/2018 Surgery   Bilateral mastectomies: Left mastectomy: IDC with papillary features grade 2, 1.7 cm, intermediate grade DCIS, LCIS, margins negative, 0/2  lymph nodes negative, ER 100%, PR 100%, Ki-67 10%, HER-2 negative ratio 1.21, T1CN0 stage Ia; Right mastectomy: DCIS intermediate grade 1.2 cm, margins negative, 3 sentinel lymph nodes negative, ER 100%, PR 80%, Tis NX stage 0   05/31/2018 Oncotype testing   Oncotype recurrence score: 0, 3% risk of distant recurrence of 9 years   07/2018 -  Anti-estrogen oral therapy   Tamoxifen daily      REVIEW OF SYSTEMS:   Constitutional: Denies fevers, chills or abnormal weight loss Eyes: Denies blurriness of vision Ears, nose, mouth, throat, and face: Denies mucositis or sore throat Respiratory: Denies cough, dyspnea or wheezes Cardiovascular: Denies palpitation, chest discomfort Gastrointestinal:  Denies nausea, heartburn or change in bowel habits Skin: Denies abnormal skin rashes Lymphatics: Denies new lymphadenopathy or easy bruising Neurological:Denies numbness, tingling or new weaknesses Behavioral/Psych: Mood is stable, no new changes  Extremities: No lower extremity edema Breast: denies any pain or lumps or nodules in either breasts All other systems were reviewed with the patient and are negative.  Observations/Objective:  There were no vitals filed for this visit. There is no height or weight on file to calculate BMI.  I have reviewed the data as listed CMP Latest Ref Rng & Units 08/26/2018 06/13/2018 05/07/2018  Glucose 70 - 99 mg/dL 79 86 173(H)  BUN 6 - 20 mg/dL 11 9 7   Creatinine 0.44 - 1.00 mg/dL 0.62 0.74 0.73  Sodium 135 - 145 mmol/L 140 139 138  Potassium 3.5 - 5.1 mmol/L 4.2 4.2 3.9  Chloride 98 - 111 mmol/L 108 103 106  CO2 22 - 32 mmol/L 24 25 21(L)  Calcium 8.9 - 10.3 mg/dL 9.1 8.8(L) 8.4(L)    No results found for: WBC, HGB, HCT, MCV, PLT, NEUTROABS  Assessment Plan:  Malignant neoplasm of upper-outer quadrant of left breast in female, estrogen receptor positive ([place]) 05/14/2018: Bilateral mastectomies:  Left mastectomy: IDC with papillary features grade 2, 1.7  cm, intermediate grade DCIS, LCIS, margins negative, 0/2 lymph nodes negative, ER 100%, PR 100%, Ki-67 10%, HER-2 negative ratio 1.21, T1CN0 stage Ia; Right mastectomy: DCIS intermediate grade 1.2 cm, margins negative, 0/3 lymph nodes negative, ER 100%, PR 80%, Tis NX stage 0 Oncotype DX testing on the left breast: Recurrence score 0: 3% risk of distant recurrence of 9 years  Current treatment: Tamoxifen 20 mg daily x5 years started 06/11/2018  Tamoxifen toxicities: No side effects Not had a cycle since July.  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  Encouraged patient to exercise. Return to clinic in 1 year for follow-up.   I discussed the assessment and treatment plan with the patient. The patient was provided an opportunity to ask questions and all were answered. The patient agreed with the plan and demonstrated an understanding of the instructions. The patient was advised to call back or seek an in-person evaluation if the symptoms worsen or if the condition fails to improve as anticipated.   I provided 15 minutes of face-to-face Doximity time during this encounter.    Rulon Eisenmenger, MD 04/28/2019   I, Molly Dorshimer, am acting as scribe for Nicholas Lose, MD.  I have reviewed the above documentation for accuracy and completeness, and I agree with the above.

## 2019-04-28 ENCOUNTER — Inpatient Hospital Stay: Payer: BC Managed Care – PPO | Attending: Hematology and Oncology | Admitting: Hematology and Oncology

## 2019-04-28 DIAGNOSIS — Z9013 Acquired absence of bilateral breasts and nipples: Secondary | ICD-10-CM

## 2019-04-28 DIAGNOSIS — Z79810 Long term (current) use of selective estrogen receptor modulators (SERMs): Secondary | ICD-10-CM | POA: Diagnosis not present

## 2019-04-28 DIAGNOSIS — C50412 Malignant neoplasm of upper-outer quadrant of left female breast: Secondary | ICD-10-CM

## 2019-04-28 DIAGNOSIS — Z170 Estrogen receptor positive status [ER+]: Secondary | ICD-10-CM

## 2019-04-28 MED ORDER — TAMOXIFEN CITRATE 20 MG PO TABS: 20.0000 mg | ORAL_TABLET | Freq: Every day | ORAL | 3 refills | Status: DC

## 2019-08-12 ENCOUNTER — Encounter: Payer: Self-pay | Admitting: Gastroenterology

## 2019-09-01 IMAGING — US ULTRASOUND RIGHT BREAST LIMITED
1 series · 12 of 12 positions shown · non-contrast
Comparison: Previous exam(s).

CLINICAL DATA: Bilateral mastectomies with reconstruction including
fat ejection. Bilateral lumps.

EXAM:
ULTRASOUND OF THE BILATERAL BREAST

[Series 1: ultrasound right breast limited · 0.06mm/px · 12 of 12 slices shown]
[im 1/12]
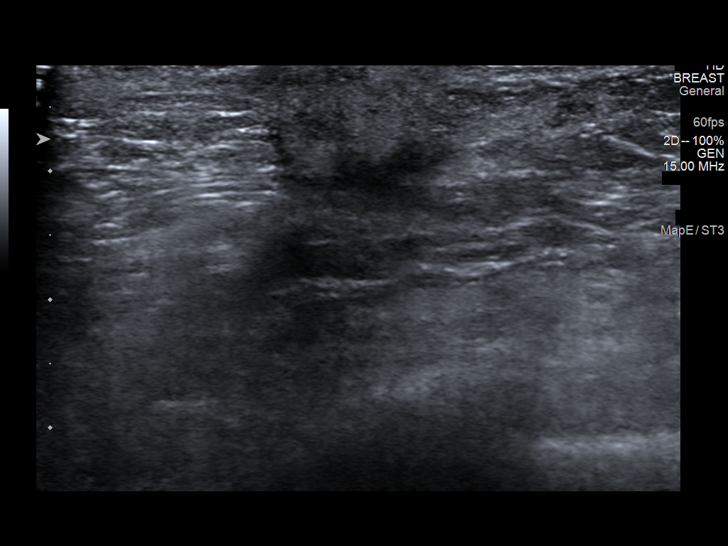
[im 2/12]
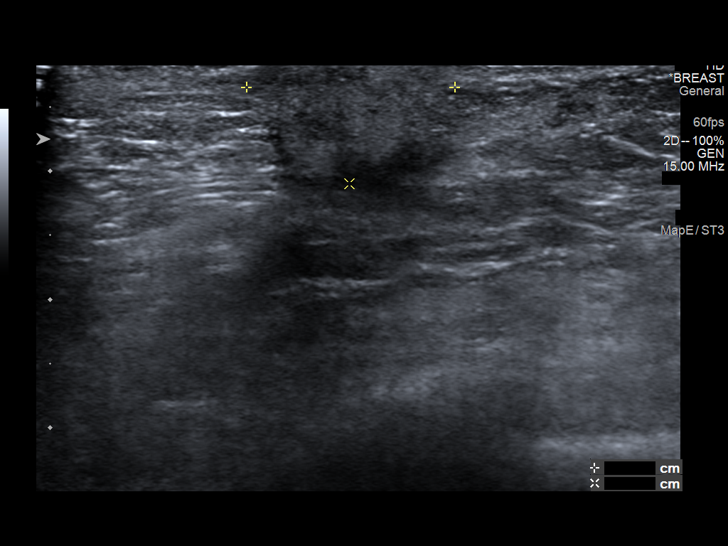
[im 3/12]
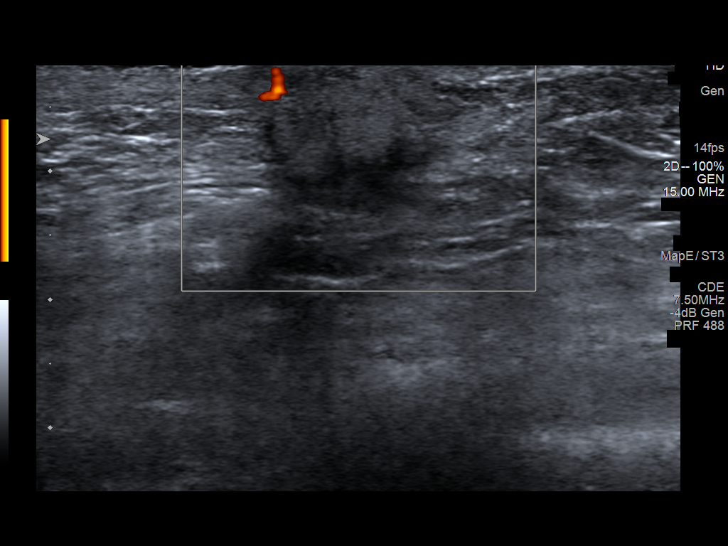
[im 4/12]
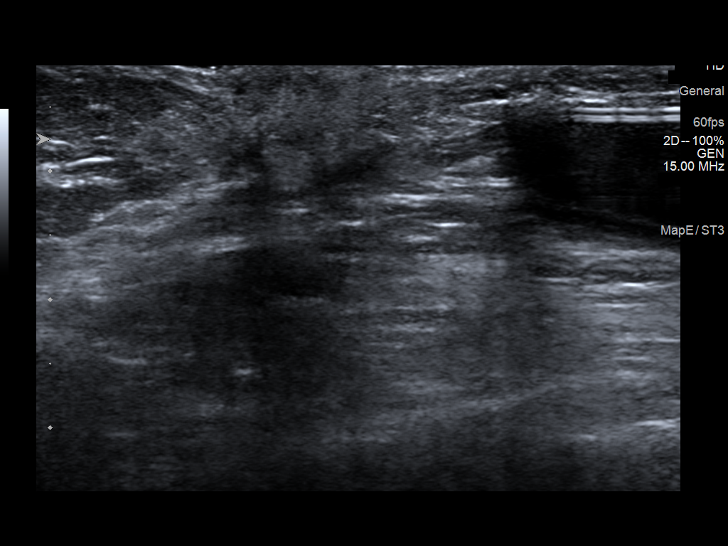
[im 5/12]
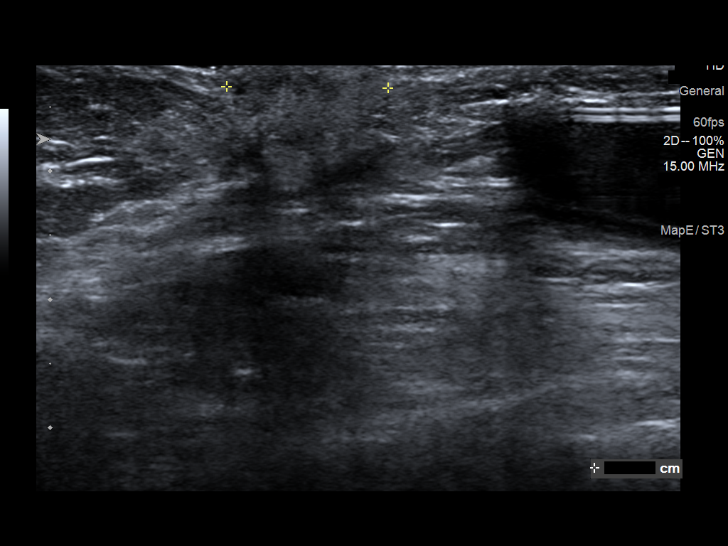
[im 6/12]
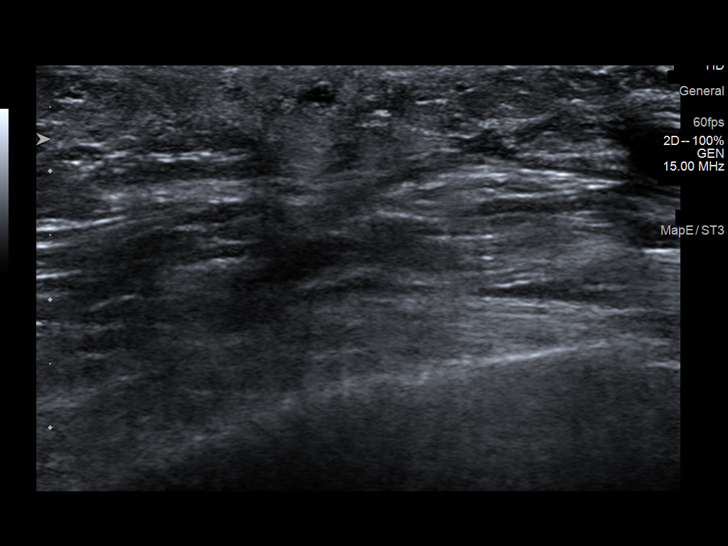
[im 7/12]
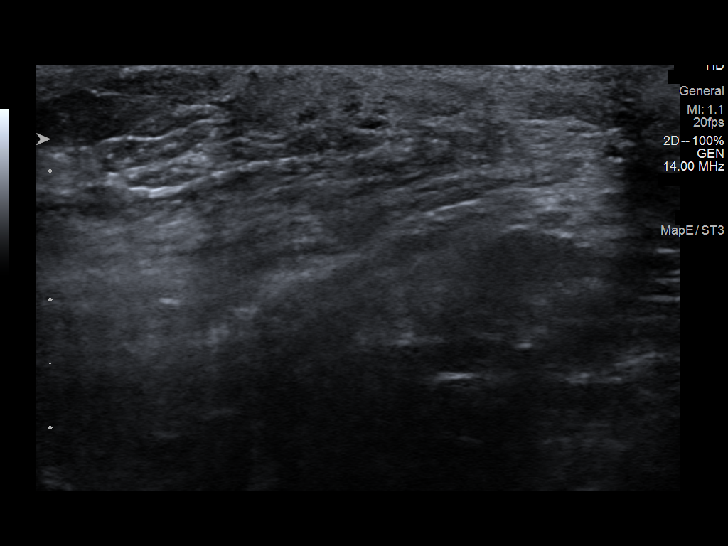
[im 8/12]
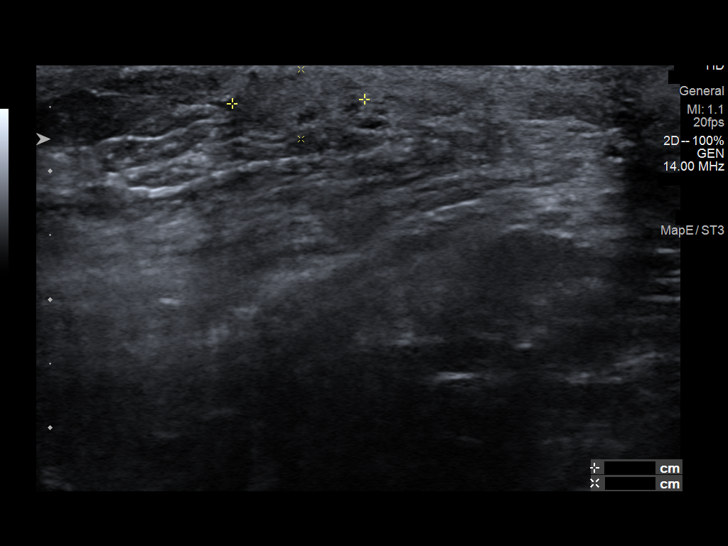
[im 9/12]
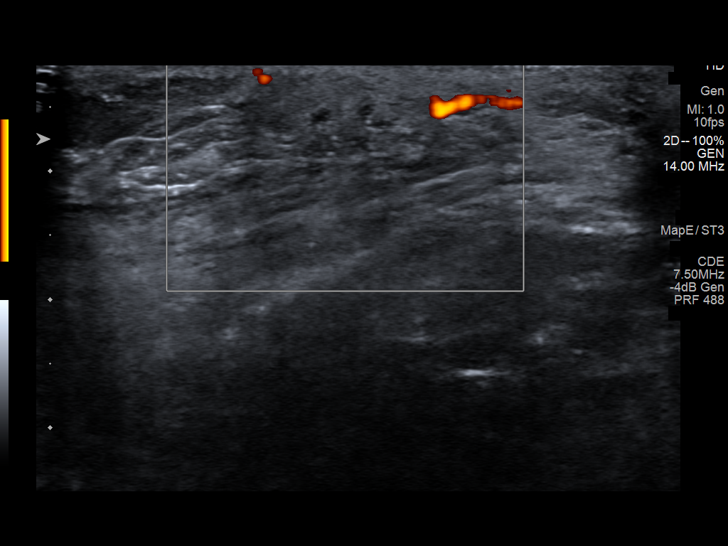
[im 10/12]
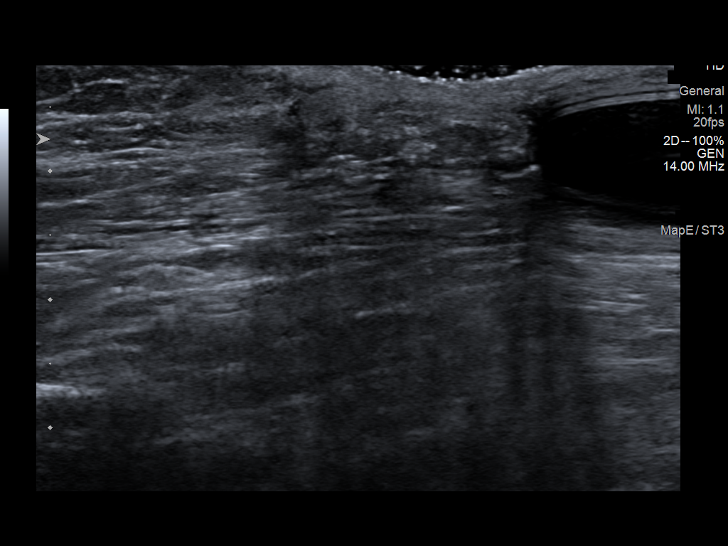
[im 11/12]
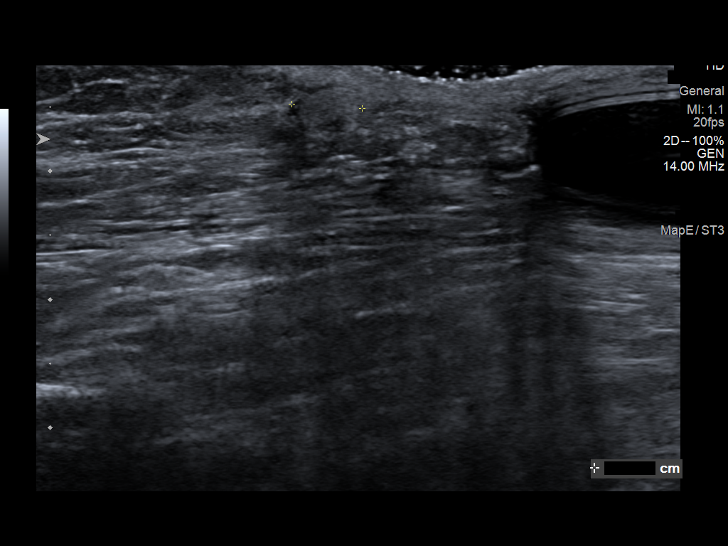
[im 12/12]
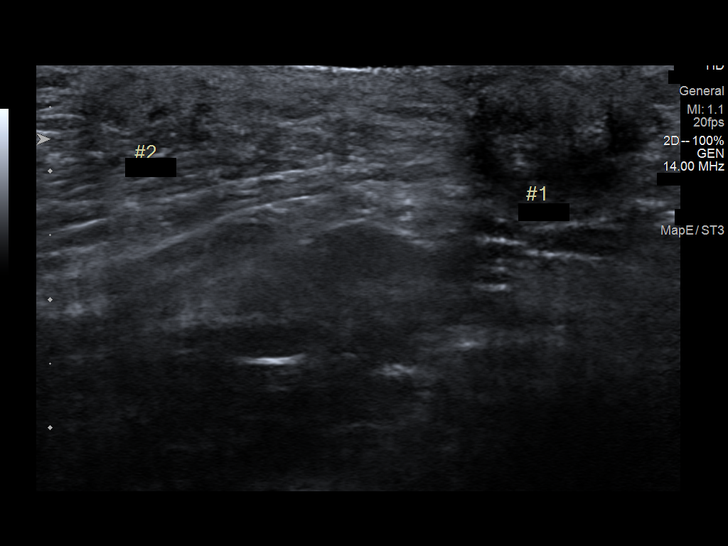

[12 of 12 positions shown; findings below may reference images not displayed]

FINDINGS: On physical exam, multiple lumps are pointed out by the patient.

Targeted ultrasound is performed, showing hyperechoic masses
correlating with the patient's lumps, consistent with fat necrosis
given history.
IMPRESSION: The patient appears to be palpating fat necrosis bilaterally.

RECOMMENDATION:
No follow-up necessary for the fat necrosis.

I have discussed the findings and recommendations with the patient.
Results were also provided in writing at the conclusion of the
visit. If applicable, a reminder letter will be sent to the patient
regarding the next appointment.

BI-RADS CATEGORY  2: Benign.

## 2019-09-22 ENCOUNTER — Telehealth: Payer: Self-pay | Admitting: *Deleted

## 2019-09-22 ENCOUNTER — Encounter: Payer: Self-pay | Admitting: *Deleted

## 2019-09-22 ENCOUNTER — Ambulatory Visit: Payer: BC Managed Care – PPO

## 2019-09-22 NOTE — Telephone Encounter (Signed)
Noted  

## 2019-09-22 NOTE — Telephone Encounter (Signed)
PATIENT WAS A NO SHOW AND LETTER SENT  °

## 2019-09-29 ENCOUNTER — Other Ambulatory Visit: Payer: Self-pay

## 2019-09-29 ENCOUNTER — Ambulatory Visit (INDEPENDENT_AMBULATORY_CARE_PROVIDER_SITE_OTHER): Payer: Self-pay | Admitting: *Deleted

## 2019-09-29 DIAGNOSIS — Z86010 Personal history of colonic polyps: Secondary | ICD-10-CM

## 2019-09-29 MED ORDER — NA SULFATE-K SULFATE-MG SULF 17.5-3.13-1.6 GM/177ML PO SOLN: 1.0000 | Freq: Once | ORAL | 0 refills | Status: AC

## 2019-09-29 NOTE — Patient Instructions (Addendum)
Maureen Mahoney  July 11, 1966 MRN: [id number]     Procedure Date: 10/20/2019 Time to register: 9:30 am Place to register: [facility] Procedure Time: 10:30 am Scheduled provider: Dr. Oneida Alar    PREPARATION FOR COLONOSCOPY WITH SUPREP BOWEL PREP KIT  Note: Suprep Bowel Prep Kit is a split-dose (2day) regimen. Consumption of BOTH 6-ounce bottles is required for a complete prep.  Please notify us immediately if you are diabetic, take iron supplements, or if you are on Coumadin or any other blood thinners.  Please hold the following medications: N/A                                                                                                                                                1 DAY BEFORE PROCEDURE:  DATE: 10/19/2019   DAY: Sunday Continue clear liquids the entire day - NO SOLID FOOD.   Diabetic medications adjustments for today: No need to adjust anything per provider.  At 6:00pm: Complete steps 1 through 4 below, using ONE (1) 6-ounce bottle, before going to bed. Step 1:  Pour ONE (1) 6-ounce bottle of SUPREP liquid into the mixing container.  Step 2:  Add cool drinking water to the 16 ounce line on the container and mix.  Note: Dilute the solution concentrate as directed prior to use. Step 3:  DRINK ALL the liquid in the container. Step 4:  You MUST drink an additional two (2) or more 16 ounce containers of water over the next one (1) hour.   Continue clear liquids.  DAY OF PROCEDURE:   DATE: 10/20/2019  DAY: Monday If you take medications for your heart, blood pressure, or breathing, you may take these medications.  Diabetic medications adjustments for today: No need to adjust anything per provider.  5 hours before your procedure at:  5:30 am Step 1:  Pour ONE (1) 6-ounce bottle of SUPREP liquid into the mixing container.  Step 2:  Add cool drinking water to the 16 ounce line on the container and mix.  Note: Dilute the solution concentrate as directed  prior to use. Step 3:  DRINK ALL the liquid in the container. Step 4:  You MUST drink an additional two (2) or more 16 ounce containers of water over the next one (1) hour. You MUST complete the final glass of water at least 3 hours before your colonoscopy. Nothing by mouth past 7:30 am  You may take your morning medications with sip of water unless we have instructed otherwise.    Please see below for Dietary Information.  CLEAR LIQUIDS INCLUDE:  Water Jello (NOT red in color)   Ice Popsicles (NOT red in color)   Tea (sugar ok, no milk/cream) Powdered fruit flavored drinks  Coffee (sugar ok, no milk/cream) Gatorade/ Lemonade/ Kool-Aid  (NOT red in color)   Juice: apple, white grape, white  cranberry Soft drinks  Clear bullion, consomme, broth (fat free beef/chicken/vegetable)  Carbonated beverages (any kind)  Strained chicken noodle soup Hard Candy   Remember: Clear liquids are liquids that will allow you to see your fingers on the other side of a clear glass. Be sure liquids are NOT red in color, and not cloudy, but CLEAR.  DO NOT EAT OR DRINK ANY OF THE FOLLOWING:  Dairy products of any kind   Cranberry juice Tomato juice / V8 juice   Grapefruit juice Orange juice     Red grape juice  Do not eat any solid foods, including such foods as: cereal, oatmeal, yogurt, fruits, vegetables, creamed soups, eggs, bread, crackers, pureed foods in a blender, etc.   HELPFUL HINTS FOR DRINKING PREP SOLUTION:   Make sure prep is extremely cold. Mix and refrigerate the the morning of the prep. You may also put in the freezer.   You may try mixing some Crystal Light or Country Time Lemonade if you prefer. Mix in small amounts; add more if necessary.  Try drinking through a straw  Rinse mouth with water or a mouthwash between glasses, to remove after-taste.  Try sipping on a cold beverage /ice/ popsicles between glasses of prep.  Place a piece of sugar-free hard candy in mouth between  glasses.  If you become nauseated, try consuming smaller amounts, or stretch out the time between glasses. Stop for 30-60 minutes, then slowly start back drinking.     OTHER INSTRUCTIONS  You will need a responsible adult at least 53 years of age to accompany you and drive you home. This person must remain in the waiting room during your procedure. The hospital will cancel your procedure if you do not have a responsible adult with you.   1. Wear loose fitting clothing that is easily removed. 2. Leave jewelry and other valuables at home.  3. Remove all body piercing jewelry and leave at home. 4. Total time from sign-in until discharge is approximately 2-3 hours. 5. You should go home directly after your procedure and rest. You can resume normal activities the day after your procedure. 6. The day of your procedure you should not:  Drive  Make legal decisions  Operate machinery  Drink alcohol  Return to work   You may call the office (Dept: [phone]) before 5:00pm, or page the doctor on call [phone]) after 5:00pm, for further instructions, if necessary.   Insurance Information YOU WILL NEED TO CHECK WITH YOUR INSURANCE COMPANY FOR THE BENEFITS OF COVERAGE YOU HAVE FOR THIS PROCEDURE.  UNFORTUNATELY, NOT ALL INSURANCE COMPANIES HAVE BENEFITS TO COVER ALL OR PART OF THESE TYPES OF PROCEDURES.  IT IS YOUR RESPONSIBILITY TO CHECK YOUR BENEFITS, HOWEVER, WE WILL BE GLAD TO ASSIST YOU WITH ANY CODES YOUR INSURANCE COMPANY MAY NEED.    PLEASE NOTE THAT MOST INSURANCE COMPANIES WILL NOT COVER A SCREENING COLONOSCOPY FOR PEOPLE UNDER THE AGE OF 50  IF YOU HAVE BCBS INSURANCE, YOU MAY HAVE BENEFITS FOR A SCREENING COLONOSCOPY BUT IF POLYPS ARE FOUND THE DIAGNOSIS WILL CHANGE AND THEN YOU MAY HAVE A DEDUCTIBLE THAT WILL NEED TO BE MET. SO PLEASE MAKE SURE YOU CHECK YOUR BENEFITS FOR A SCREENING COLONOSCOPY AS WELL AS A DIAGNOSTIC COLONOSCOPY.

## 2019-09-29 NOTE — Progress Notes (Signed)
Gastroenterology Pre-Procedure Review  Request Date: 09/29/2019 Requesting Physician: Repeat TCS needed per Dr. Oneida Alar, see 12/09/2018 path report note, Last TCS 12/09/2018 done by Dr. 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 Diabetes Melitis: yes 2. Joint replacements in the past 12 months: no 3. Major health problems in the past 3 months: no 4. Has an artificial valve or MVP: no 5. Has a defibrillator: no 6. Has been advised in past to take antibiotics in advance of a procedure like teeth cleaning: no 7. Family history of colon cancer: no  8. Alcohol Use: yes, 1 glass of wine a week 9. Illicit drug Use: no 10. History of sleep apnea: no  11. History of coronary artery or other vascular stents placed within the last 12 months: no 12. History of any prior anesthesia complications: no 13. There is no height or weight on file to calculate BMI.ht: 5'5 wt: 200 lbs    MEDICATIONS & ALLERGIES:    Patient reports the following regarding taking any blood thinners:   Plavix? no Aspirin? yes Coumadin? no Brilinta? no Xarelto? no Eliquis? no Pradaxa? no Savaysa? no Effient? no  Patient confirms/reports the following medications:  Current Outpatient Medications  Medication Sig Dispense Refill  . ALPRAZolam (XANAX) 0.25 MG tablet Take 0.25 mg by mouth as needed for anxiety.    Marland Kitchen aspirin 81 MG chewable tablet Chew 81 mg by mouth daily.     Marland Kitchen atorvastatin (LIPITOR) 10 MG tablet Take 10 mg by mouth daily.    . cetirizine (ZYRTEC) 10 MG tablet Take 10 mg by mouth daily.     Marland Kitchen docusate sodium (COLACE) 100 MG capsule Take 100 mg by mouth daily.    Marland Kitchen escitalopram (LEXAPRO) 10 MG tablet Take 10 mg by mouth daily.    . fluticasone (FLONASE) 50 MCG/ACT nasal spray Place 2 sprays into both nostrils daily as needed for allergies.     . metFORMIN (GLUCOPHAGE) 500 MG tablet  Take 500 mg by mouth 2 (two) times daily with a meal.     . methocarbamol (ROBAXIN) 500 MG tablet Take 500 mg by mouth as needed for muscle spasms.    . Multiple Vitamins-Minerals (MULTIVITAMIN WITH MINERALS) tablet Take 1 tablet by mouth daily.    . tamoxifen (NOLVADEX) 20 MG tablet Take 1 tablet (20 mg total) by mouth daily. 90 tablet 3   No current facility-administered medications for this visit.     Patient confirms/reports the following allergies:  Allergies  Allergen Reactions  . Penicillins Other (See Comments)    Pt's mother is allergic, has not tried it    No orders of the defined types were placed in this encounter.   AUTHORIZATION INFORMATION Primary Insurance: [place],  [place] #: [id number],  Group #: [id number] Pre-Cert / Josem Kaufmann required: No, not required  SCHEDULE INFORMATION: Procedure has been scheduled as follows:  Date: 10/20/2019, Time: 10:30 Location: [facility] with Dr. Oneida Alar  This Gastroenterology Pre-Precedure Review Form is being routed to the following provider(s): Aliene Altes, PA

## 2019-09-29 NOTE — Progress Notes (Signed)
Ok to schedule. No need to adjust diabetes medications as she is only on Metformin.

## 2019-09-30 NOTE — Addendum Note (Signed)
Addended by: Metro Kung on: 09/30/2019 07:32 AM   Modules accepted: Orders, SmartSet

## 2019-10-17 ENCOUNTER — Other Ambulatory Visit: Payer: Self-pay

## 2019-10-17 ENCOUNTER — Other Ambulatory Visit (HOSPITAL_COMMUNITY)
Admission: RE | Admit: 2019-10-17 | Discharge: 2019-10-17 | Disposition: A | Discharge status: Home / Self Care | Payer: BC Managed Care – PPO | Source: Ambulatory Visit | Attending: Gastroenterology | Admitting: Gastroenterology

## 2019-10-17 DIAGNOSIS — Z01812 Encounter for preprocedural laboratory examination: Secondary | ICD-10-CM | POA: Diagnosis present

## 2019-10-17 DIAGNOSIS — Z20828 Contact with and (suspected) exposure to other viral communicable diseases: Secondary | ICD-10-CM | POA: Diagnosis not present

## 2019-10-17 LAB — SARS CORONAVIRUS 2 (TAT 6-24 HRS): SARS Coronavirus 2: NEGATIVE

## 2019-10-20 ENCOUNTER — Ambulatory Visit (HOSPITAL_COMMUNITY)
Admission: RE | Admit: 2019-10-20 | Discharge: 2019-10-20 | Disposition: A | Discharge status: Home / Self Care | Payer: BC Managed Care – PPO | Attending: Gastroenterology | Admitting: Gastroenterology

## 2019-10-20 ENCOUNTER — Encounter (HOSPITAL_COMMUNITY): Payer: Self-pay | Admitting: *Deleted

## 2019-10-20 ENCOUNTER — Other Ambulatory Visit: Payer: Self-pay

## 2019-10-20 ENCOUNTER — Encounter (HOSPITAL_COMMUNITY)
Admission: RE | Disposition: A | Discharge status: Home / Self Care | Payer: Self-pay | Source: Home / Self Care | Attending: Gastroenterology

## 2019-10-20 DIAGNOSIS — Z801 Family history of malignant neoplasm of trachea, bronchus and lung: Secondary | ICD-10-CM | POA: Diagnosis not present

## 2019-10-20 DIAGNOSIS — Z1211 Encounter for screening for malignant neoplasm of colon: Secondary | ICD-10-CM | POA: Diagnosis present

## 2019-10-20 DIAGNOSIS — Z7984 Long term (current) use of oral hypoglycemic drugs: Secondary | ICD-10-CM | POA: Diagnosis not present

## 2019-10-20 DIAGNOSIS — Z803 Family history of malignant neoplasm of breast: Secondary | ICD-10-CM | POA: Insufficient documentation

## 2019-10-20 DIAGNOSIS — K589 Irritable bowel syndrome without diarrhea: Secondary | ICD-10-CM | POA: Diagnosis not present

## 2019-10-20 DIAGNOSIS — Z859 Personal history of malignant neoplasm, unspecified: Secondary | ICD-10-CM | POA: Insufficient documentation

## 2019-10-20 DIAGNOSIS — K621 Rectal polyp: Secondary | ICD-10-CM | POA: Diagnosis not present

## 2019-10-20 DIAGNOSIS — Q438 Other specified congenital malformations of intestine: Secondary | ICD-10-CM | POA: Insufficient documentation

## 2019-10-20 DIAGNOSIS — Z860100 Personal history of colon polyps, unspecified: Secondary | ICD-10-CM

## 2019-10-20 DIAGNOSIS — Z853 Personal history of malignant neoplasm of breast: Secondary | ICD-10-CM | POA: Diagnosis not present

## 2019-10-20 DIAGNOSIS — Z791 Long term (current) use of non-steroidal anti-inflammatories (NSAID): Secondary | ICD-10-CM | POA: Insufficient documentation

## 2019-10-20 DIAGNOSIS — F419 Anxiety disorder, unspecified: Secondary | ICD-10-CM | POA: Insufficient documentation

## 2019-10-20 DIAGNOSIS — K648 Other hemorrhoids: Secondary | ICD-10-CM | POA: Diagnosis not present

## 2019-10-20 DIAGNOSIS — Z880 Allergy status to penicillin: Secondary | ICD-10-CM | POA: Diagnosis not present

## 2019-10-20 DIAGNOSIS — Z9013 Acquired absence of bilateral breasts and nipples: Secondary | ICD-10-CM | POA: Diagnosis not present

## 2019-10-20 DIAGNOSIS — Z79899 Other long term (current) drug therapy: Secondary | ICD-10-CM | POA: Insufficient documentation

## 2019-10-20 DIAGNOSIS — D125 Benign neoplasm of sigmoid colon: Secondary | ICD-10-CM | POA: Insufficient documentation

## 2019-10-20 DIAGNOSIS — K644 Residual hemorrhoidal skin tags: Secondary | ICD-10-CM | POA: Diagnosis not present

## 2019-10-20 DIAGNOSIS — Z8249 Family history of ischemic heart disease and other diseases of the circulatory system: Secondary | ICD-10-CM | POA: Diagnosis not present

## 2019-10-20 DIAGNOSIS — E119 Type 2 diabetes mellitus without complications: Secondary | ICD-10-CM | POA: Diagnosis not present

## 2019-10-20 DIAGNOSIS — Z7982 Long term (current) use of aspirin: Secondary | ICD-10-CM | POA: Insufficient documentation

## 2019-10-20 DIAGNOSIS — Z820 Family history of epilepsy and other diseases of the nervous system: Secondary | ICD-10-CM | POA: Insufficient documentation

## 2019-10-20 HISTORY — PX: POLYPECTOMY: SHX5525

## 2019-10-20 HISTORY — PX: COLONOSCOPY: SHX5424

## 2019-10-20 LAB — GLUCOSE, CAPILLARY: Glucose-Capillary: 121 mg/dL — ABNORMAL HIGH (ref 70–99)

## 2019-10-20 SURGERY — COLONOSCOPY
Anesthesia: Moderate Sedation

## 2019-10-20 MED ORDER — MIDAZOLAM HCL 5 MG/5ML IJ SOLN
INTRAMUSCULAR | Status: DC | PRN
  Administered 2019-10-20 (×2): 2 mg via INTRAVENOUS
  Administered 2019-10-20: 1 mg via INTRAVENOUS

## 2019-10-20 MED ORDER — SODIUM CHLORIDE 0.9 % IV SOLN
INTRAVENOUS | Status: DC
  Administered 2019-10-20: 10:00:00 via INTRAVENOUS

## 2019-10-20 MED ORDER — MEPERIDINE HCL 100 MG/ML IJ SOLN
INTRAMUSCULAR | Status: DC | PRN
  Administered 2019-10-20: 50 mg
  Administered 2019-10-20: 25 mg

## 2019-10-20 MED ORDER — MIDAZOLAM HCL 5 MG/5ML IJ SOLN
INTRAMUSCULAR | Status: AC
  Filled 2019-10-20: qty 10

## 2019-10-20 MED ORDER — MEPERIDINE HCL 100 MG/ML IJ SOLN
INTRAMUSCULAR | Status: AC
  Filled 2019-10-20: qty 2

## 2019-10-20 NOTE — H&P (Signed)
Primary Care Physician:  Asencion Noble, MD Primary Gastroenterologist:  Dr. Oneida Alar  Pre-Procedure History & Physical: HPI:  Maureen Mahoney is a 53 y.o. female here for  PERSONAL HISTORY OF POLYPS.  Past Medical History:  Diagnosis Date  . Anxiety   . Diabetes mellitus without complication ([place])   . 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 Family history of breast cancer   . Family history of lung cancer   . Family history of melanoma   . Irritable bowel disease     Past Surgical History:  Procedure Laterality Date  . BREAST RECONSTRUCTION WITH PLACEMENT OF TISSUE EXPANDER AND ALLODERM Bilateral 05/14/2018   Procedure: BILATERAL BREAST RECONSTRUCTION WITH PLACEMENT OF TISSUE EXPANDER AND ACELLULAR DERMIS;  Surgeon: Irene Limbo, MD;  Location: [facility];  Service: Plastics;  Laterality: Bilateral;  . COLONOSCOPY N/A 12/09/2018   Procedure: COLONOSCOPY;  Surgeon: Danie Binder, MD;  [facility];  Service: Endoscopy;  Laterality: N/A;  1:00pm  . LIPOSUCTION WITH LIPOFILLING Bilateral 08/30/2018   Procedure: LIPOFILLING FROM ABDOMEN TO BILATERAL CHEST;  Surgeon: Irene Limbo, MD;  Location: [place] [facility];  Service: Plastics;  Laterality: Bilateral;  . MASTECTOMY W/ SENTINEL NODE BIOPSY Bilateral 05/14/2018   Procedure: BILATERAL TOTAL MASTECTOMIES WITH BILATERAL SENTINEL LYMPH NODE BIOPSIES;  Surgeon: Rolm Bookbinder, MD;  Location: [facility];  Service: General;  Laterality: Bilateral;  . POLYPECTOMY  12/09/2018   Procedure: POLYPECTOMY;  Surgeon: Danie Binder, MD;  [facility];  Service: Endoscopy;;  . REMOVAL OF BILATERAL TISSUE EXPANDERS WITH PLACEMENT OF BILATERAL BREAST IMPLANTS Bilateral 08/30/2018   Procedure: REMOVAL OF BILATERAL TISSUE EXPANDERS WITH PLACEMENT OF BILATERAL BREAST IMPLANTS;  Surgeon: Irene Limbo,  MD;  Location: [facility];  Service: Plastics;  Laterality: Bilateral;  . TISSUE EXPANDER PLACEMENT Left 06/14/2018   Procedure: removal and replacement left chest tissue expander;  Surgeon: Irene Limbo, MD;  Location: [facility];  Service: Plastics;  Laterality: Left;    Prior to Admission medications   Medication Sig Start Date End Date Taking? Authorizing Provider  ALPRAZolam (XANAX) 0.25 MG tablet Take 0.25 mg by mouth 2 (two) times daily as needed for anxiety.    Yes [provider]  aspirin 81 MG chewable tablet Chew 81 mg by mouth every evening.    Yes [provider]  aspirin-acetaminophen-caffeine (EXCEDRIN MIGRAINE) [phone] MG tablet Take 2 tablets by mouth every 6 (six) hours as needed for headache.   Yes [provider]  atorvastatin (LIPITOR) 10 MG tablet Take 10 mg by mouth daily.   Yes [provider]  cetirizine (ZYRTEC) 10 MG tablet Take 10 mg by mouth daily.    Yes [provider]  docusate sodium (COLACE) 100 MG capsule Take 100 mg by mouth daily.   Yes [provider]  escitalopram (LEXAPRO) 10 MG tablet Take 10 mg by mouth daily.   Yes [provider]  fluticasone (FLONASE) 50 MCG/ACT nasal spray Place 2 sprays into both nostrils daily as needed for allergies.    Yes [provider]  ibuprofen (ADVIL) 200 MG tablet Take 400 mg by mouth every 6 (six) hours as needed.   Yes [provider]  metFORMIN (GLUCOPHAGE) 500 MG tablet Take 500 mg by mouth 2 (two) times daily with a meal.    Yes [provider]  methocarbamol (  ROBAXIN) 500 MG tablet Take 500 mg by mouth every 8 (eight) hours as needed for muscle spasms.    Yes [provider]  Multiple Vitamins-Minerals (MULTIVITAMIN WITH MINERALS) tablet Take 1 tablet by mouth every evening.    Yes [provider]  naproxen sodium (ALEVE) 220 MG tablet Take 440 mg by mouth 2 (two) times daily  as needed (pain).   Yes [provider]  Polyethyl Glycol-Propyl Glycol (LUBRICANT EYE DROPS) 0.4-0.3 % SOLN Apply 1 drop to eye.   Yes [provider]  SUPREP BOWEL PREP KIT 17.5-3.13-1.6 GM/177ML SOLN Take 354 mLs by mouth once. 09/29/19  Yes [provider]  tamoxifen (NOLVADEX) 20 MG tablet Take 1 tablet (20 mg total) by mouth daily. Patient taking differently: Take 20 mg by mouth every evening.  04/28/19  Yes Nicholas Lose, MD    Allergies as of 09/30/2019 - Review Complete 09/29/2019  Allergen Reaction Noted  . Penicillins Other (See Comments) 06/26/2016    Family History  Problem Relation Age of Onset  . Lung cancer Mother 51  . Heart disease Maternal Uncle   . Heart disease Paternal Aunt   . Other Maternal Grandmother        pituitary tumor, not cancer  . Parkinson's disease Maternal Grandfather   . Breast cancer Other 80  . Breast cancer Other 50  . Breast cancer Other 80  . Lymphoma Other   . Colon cancer Neg Hx   . Gastric cancer Neg Hx   . Esophageal cancer Neg Hx     Social History   Socioeconomic History  . Marital status: Divorced    Spouse name: Not on file  . Number of children: Not on file  . Years of education: Not on file  . Highest education level: Not on file  Occupational History  . Not on file  Social Needs  . Financial resource strain: Not on file  . Food insecurity    Worry: Not on file    Inability: Not on file  . Transportation needs    Medical: Not on file    Non-medical: Not on file  Tobacco Use  . Smoking status: Never Smoker  . Smokeless tobacco: Never Used  Substance and Sexual Activity  . Alcohol use: Yes    Comment: occass  . Drug use: Never  . Sexual activity: Not Currently  Lifestyle  . Physical activity    Days per week: Not on file    Minutes per session: Not on file  . Stress: Not on file  Relationships  . Social Herbalist on phone: Not on file    Gets together: Not on file     Attends religious service: Not on file    Active member of club or organization: Not on file    Attends meetings of clubs or organizations: Not on file    Relationship status: Not on file  . Intimate partner violence    Fear of current or ex partner: Not on file    Emotionally abused: Not on file    Physically abused: Not on file    Forced sexual activity: Not on file  Other Topics Concern  . Not on file  Social History Narrative  . Not on file    Review of Systems: See HPI, otherwise negative ROS   Physical Exam: BP 136/83   Pulse 62   Temp 97.9 F (36.6 C) (Oral)   Resp 12   SpO2 100%  General:   Alert,  pleasant and cooperative in NAD Head:  Normocephalic and atraumatic. Neck:  Supple; Lungs:  Clear throughout to auscultation.    Heart:  Regular rate and rhythm. Abdomen:  Soft, nontender and nondistended. Normal bowel sounds, without guarding, and without rebound.   Neurologic:  Alert and  oriented x4;  grossly normal neurologically.  Impression/Plan:      PERSONAL HISTORY OF POLYPS.  PLAN: 1. TCS TODAY. DISCUSSED PROCEDURE, BENEFITS, & RISKS: < 1% chance of medication reaction, bleeding, perforation, ASPIRATION, or rupture of spleen/liver requiring surgery to fix it and missed polyps < 1 cm 10-20% of the time.

## 2019-10-20 NOTE — Discharge Instructions (Signed)
You had 1 small AND ONE LARGE polyp removed. You have SMALL internal hemorrhoids.  EAT TO LIVE AND THINK OF FOOD AS MEDICINE. 75% OF YOUR PLATE SHOULD BE FRUITS/VEGGIES.  To have more energy, and to lose weight:      1. CONTINUE YOUR WEIGHT LOSS EFFORTS. I RECOMMEND YOU READ AND FOLLOW RECOMMENDATIONS BY DR. MARK HYMAN, "10-DAY DETOX DIET".    2. If you must eat bread, EAT EZEKIEL BREAD. IT IS IN THE FROZEN SECTION OF THE GROCERY STORE.    3. DRINK WATER WITH FRUIT OR CUCUMBER ADDED. YOUR URINE SHOULD BE LIGHT YELLOW. AVOID SODA, GATORADE, ENERGY DRINKS, OR DIET SODA.     4. AVOID HIGH FRUCTOSE CORN SYRUP AND CAFFEINE.     5. DO NOT chew SUGAR FREE GUM OR USE ARTIFICIAL SWEETENERS. IF NEEDED USE STEVIA AS A SWEETENER.    6. DO NOT EAT ENRICHED WHEAT FLOUR, PASTA, RICE, OR CEREAL.    7. ONLY EAT WILD CAUGHT SEAFOOD, GRASS FED BEEF OR CHICKEN, PORK FROM PASTURE RAISE PIGS, OR EGGS FROM PASTURE RAISED CHICKENS.    8. PRACTICE CHAIR YOGA FOR 15-30 MINS 3 OR 4 TIMES A WEEK AND PROGRESS TO HATHA YOGA OVER NEXT 6 MOS.    9. START TAKING A MULTIVITAMIN, VITAMIN B12, AND VITAMIN D3 2000 IU DAILY.   ADDITIONAL SUPPLEMENTS TO DECREASE CRAVING AND SUPPRESS YOUR APPETITE:    1. CINNAMON 500 MG EVERY AM PRIOR TO FIRST MEAL.   **STABILIZES BLOOD GLUCOSE/REDUCES CRAVINGS**    2. CHROMIUM 400-500 MG WITH MEALS TWICE DAILY.    **FAT BURNER**    3. GREEN TEA EXTRACT ONE DAILY.   **FAT BURNER/SUPPRESSES YOUR APPETITE**    4. ALPHA LIPOIC ACID TWICE DAILY.   **NATURAL ANTI-INFLAMMATORY SUPPLEMENT THAT IS AN ALTERNATIVE TO IBUPROFEN OR NAPROXEN**   DO NOT TAKE NAPROXEN AND IBUPROFEN IN THE SAME DAY. IT CAN CAUSE INTERNAL BLEEDING AND WILL KILL YOUR KIDNEYS.  USE PREPARATION H FOUR TIMES  A DAY IF NEEDED TO RELIEVE RECTAL PAIN/PRESSURE/BLEEDING.   YOUR BIOPSY RESULTS WILL BE BACK IN 5 BUSINESS DAYS.  Next colonoscopy in 3 years.    Colonoscopy Care After Read the instructions outlined  below and refer to this sheet in the next week. These discharge instructions provide you with general information on caring for yourself after you leave the hospital. While your treatment has been planned according to the most current medical practices available, unavoidable complications occasionally occur. If you have any problems or questions after discharge, call DR. Turhan Chill, [phone].  ACTIVITY  You may resume your regular activity, but move at a slower pace for the next 24 hours.   Take frequent rest periods for the next 24 hours.   Walking will help get rid of the air and reduce the bloated feeling in your belly (abdomen).   No driving for 24 hours (because of the medicine (anesthesia) used during the test).   You may shower.   Do not sign any important legal documents or operate any machinery for 24 hours (because of the anesthesia used during the test).    NUTRITION  Drink plenty of fluids.   You may resume your normal diet as instructed by your doctor.   Begin with a light meal and progress to your normal diet. Heavy or fried foods are harder to digest and may make you feel sick to your stomach (nauseated).   Avoid alcoholic beverages for 24 hours or as instructed.    MEDICATIONS  You may resume  your normal medications.   WHAT YOU CAN EXPECT TODAY  Some feelings of bloating in the abdomen.   Passage of more gas than usual.   Spotting of blood in your stool or on the toilet paper  .  IF YOU HAD POLYPS REMOVED DURING THE COLONOSCOPY:  Eat a soft diet IF YOU HAVE NAUSEA, BLOATING, ABDOMINAL PAIN, OR VOMITING.    FINDING OUT THE RESULTS OF YOUR TEST Not all test results are available during your visit. DR. Oneida Alar WILL CALL YOU WITHIN 14 DAYS OF YOUR PROCEDUE WITH YOUR RESULTS. Do not assume everything is normal if you have not heard from DR. Donjuan Robison, CALL HER OFFICE AT [phone].  SEEK IMMEDIATE MEDICAL ATTENTION AND CALL THE OFFICE: [phone]  IF:  You have more than a spotting of blood in your stool.   Your belly is swollen (abdominal distention).   You are nauseated or vomiting.   You have a temperature over 101F.   You have abdominal pain or discomfort that is severe or gets worse throughout the day.   High-Fiber Diet A high-fiber diet changes your normal diet to include more whole grains, legumes, fruits, and vegetables. Changes in the diet involve replacing refined carbohydrates with unrefined foods. The calorie level of the diet is essentially unchanged. The Dietary Reference Intake (recommended amount) for adult males is 38 grams per day. For adult females, it is 25 grams per day. Pregnant and lactating women should consume 28 grams of fiber per day. Fiber is the intact part of a plant that is not broken down during digestion. Functional fiber is fiber that has been isolated from the plant to provide a beneficial effect in the body.  PURPOSE  Increase stool bulk.   Ease and regulate bowel movements.   Lower cholesterol.   REDUCE RISK OF COLON CANCER  INDICATIONS THAT YOU NEED MORE FIBER  Constipation and hemorrhoids.   Uncomplicated diverticulosis (intestine condition) and irritable bowel syndrome.   Weight management.   As a protective measure against hardening of the arteries (atherosclerosis), diabetes, and cancer.   GUIDELINES FOR INCREASING FIBER IN THE DIET  Start adding fiber to the diet slowly. A gradual increase of about 5 more grams (2 servings of most fruits or vegetables) per day is best. Too rapid an increase in fiber may result in constipation, flatulence, and bloating.   Drink enough water and fluids to keep your urine clear or pale yellow. Water, juice, or caffeine-free drinks are recommended. Not drinking enough fluid may cause constipation.   Eat a variety of high-fiber foods rather than one type of fiber.   Try to increase your intake of fiber through using high-fiber foods rather than  fiber pills or supplements that contain small amounts of fiber.   The goal is to change the types of food eaten. Do not supplement your present diet with high-fiber foods, but replace foods in your present diet.    Polyps, Colon  A polyp is extra tissue that grows inside your body. Colon polyps grow in the large intestine. The large intestine, also called the colon, is part of your digestive system. It is a long, hollow tube at the end of your digestive tract where your body makes and stores stool. Most polyps are not dangerous. They are benign. This means they are not cancerous. But over time, some types of polyps can turn into cancer. Polyps that are smaller than a pea are usually not harmful. But larger polyps could someday become or may already  be cancerous. To be safe, doctors remove all polyps and test them.   PREVENTION There is not one sure way to prevent polyps. You might be able to lower your risk of getting them if you:  Eat more fruits and vegetables and less fatty food.   Do not smoke.   Avoid alcohol.   Exercise every day.   Lose weight if you are overweight.   Eating more calcium and folate can also lower your risk of getting polyps. Some foods that are rich in calcium are milk, cheese, and broccoli. Some foods that are rich in folate are chickpeas, kidney beans, and spinach.

## 2019-10-20 NOTE — Op Note (Signed)
[facility] Patient Name: Maureen Mahoney Procedure Date: 10/20/2019 11:30 AM MRN: [id number] Date of Birth: [birth year] Attending MD: Barney Drain MD, MD CSN: [id number] Age: 53 Admit Type: Outpatient Procedure:                Colonoscopy WITH COLD SNARE/SNARE CAUTERY                            POLYPECTOMY Indications:              Personal history of colonic polyps-SERRATED POLYP                            REMOVED JAN 2020 WITH INADEQUATE BOWEL PREP Providers:                Barney Drain MD, MD, Janeece Riggers, RN, Raphael Gibney, Technician Referring MD:             Asencion Noble Medicines:                Meperidine 75 mg IV, Midazolam 5 mg IV Complications:            No immediate complications. Estimated Blood Loss:     Estimated blood loss was minimal. Procedure:                Pre-Anesthesia Assessment:                           - Prior to the procedure, a History and Physical                            was performed, and patient medications and                            allergies were reviewed. The patient's tolerance of                            previous anesthesia was also reviewed. The risks                            and benefits of the procedure and the sedation                            options and risks were discussed with the patient.                            All questions were answered, and informed consent                            was obtained. Prior Anticoagulants: The patient has                            taken no previous anticoagulant or antiplatelet  agents except for NSAID medication. ASA Grade                            Assessment: II - A patient with mild systemic                            disease. After reviewing the risks and benefits,                            the patient was deemed in satisfactory condition to                            undergo the procedure. After obtaining informed                     consent, the colonoscope was passed under direct                            vision. Throughout the procedure, the patient's                            blood pressure, pulse, and oxygen saturations were                            monitored continuously. The [id number]                            scope was introduced through the anus and advanced                            to the the cecum, identified by appendiceal orifice                            and ileocecal valve. The colonoscopy was extremely                            difficult due to a redundant colon and significant                            looping. Successful completion of the procedure was                            aided by straightening and shortening the scope to                            obtain bowel loop reduction and COLOWRAP. The                            patient tolerated the procedure well. The quality                            of the bowel preparation was good. The ileocecal  valve, appendiceal orifice, and rectum were                            photographed. Scope In: 11:53:38 AM Scope Out: 12:16:45 PM Scope Withdrawal Time: 0 hours 14 minutes 9 seconds  Total Procedure Duration: 0 hours 23 minutes 7 seconds  Findings:      A 4 mm polyp was found in the rectum. The polyp was sessile. The polyp       was removed with a cold snare. Resection and retrieval were complete.      A 12 mm polyp was found in the proximal rectum. The polyp was sessile.       The polyp was removed with a hot snare. Resection and retrieval were       complete.      External and internal hemorrhoids were found.      The recto-sigmoid colon, sigmoid colon, descending colon and splenic       flexure were grossly tortuous. Impression:               - One 4 mm polyp in the rectum, removed with a cold                            snare. Resected and retrieved.                           - One 12  mm polyp in the proximal rectum, removed                            with a hot snare. Resected and retrieved.                           - External and internal hemorrhoids.                           - Tortuous colon. Moderate Sedation:      Moderate (conscious) sedation was administered by the endoscopy nurse       and supervised by the endoscopist. The following parameters were       monitored: oxygen saturation, heart rate, blood pressure, and response       to care. Total physician intraservice time was 35 minutes. Recommendation:           - Patient has a contact number available for                            emergencies. The signs and symptoms of potential                            delayed complications were discussed with the                            patient. Return to normal activities tomorrow.                            Written discharge instructions were provided to the  patient.                           - High fiber diet.                           - Continue present medications.                           - Await pathology results.                           - Repeat colonoscopy in 3 years for surveillance. Procedure Code(s):        --- Professional ---                           [phone], Colonoscopy, flexible; with removal of                            tumor(s), polyp(s), or other lesion(s) by snare                            technique                           [id number], Moderate sedation; each additional 15                            minutes intraservice time                           G0500, Moderate sedation services provided by the                            same physician or other qualified health care                            professional performing a gastrointestinal                            endoscopic service that sedation supports,                            requiring the presence of an independent trained                             observer to assist in the monitoring of the                            patient's level of consciousness and physiological                            status; initial 15 minutes of intra-service time;                            patient age 22 years or older (additional time 51  be reported with [phone], as appropriate) Diagnosis Code(s):        --- Professional ---                           K62.1, Rectal polyp                           K64.8, Other hemorrhoids                           Z86.010, Personal history of colonic polyps                           Q43.8, Other specified congenital malformations of                            intestine CPT copyright 2019 [employer]. All rights reserved. The codes documented in this report are preliminary and upon coder review may  be revised to meet current compliance requirements. Barney Drain, MD Barney Drain MD, MD 10/20/2019 1:12:29 PM This report has been signed electronically. Number of Addenda: 0

## 2019-10-21 LAB — SURGICAL PATHOLOGY

## 2019-10-22 ENCOUNTER — Encounter (HOSPITAL_COMMUNITY): Payer: Self-pay | Admitting: Gastroenterology

## 2019-11-29 IMAGING — DX RIGHT HAND - COMPLETE 3+ VIEW
3 series · 3 of 3 positions shown · non-contrast
Comparison: None.

CLINICAL DATA: 50-year-old who fell approximately 1 week ago
walking up the steps to her porch. Persistent pain and swelling
involving the hand. Initial encounter.

EXAM:
RIGHT HAND - COMPLETE 3+ VIEW

[hand pa]
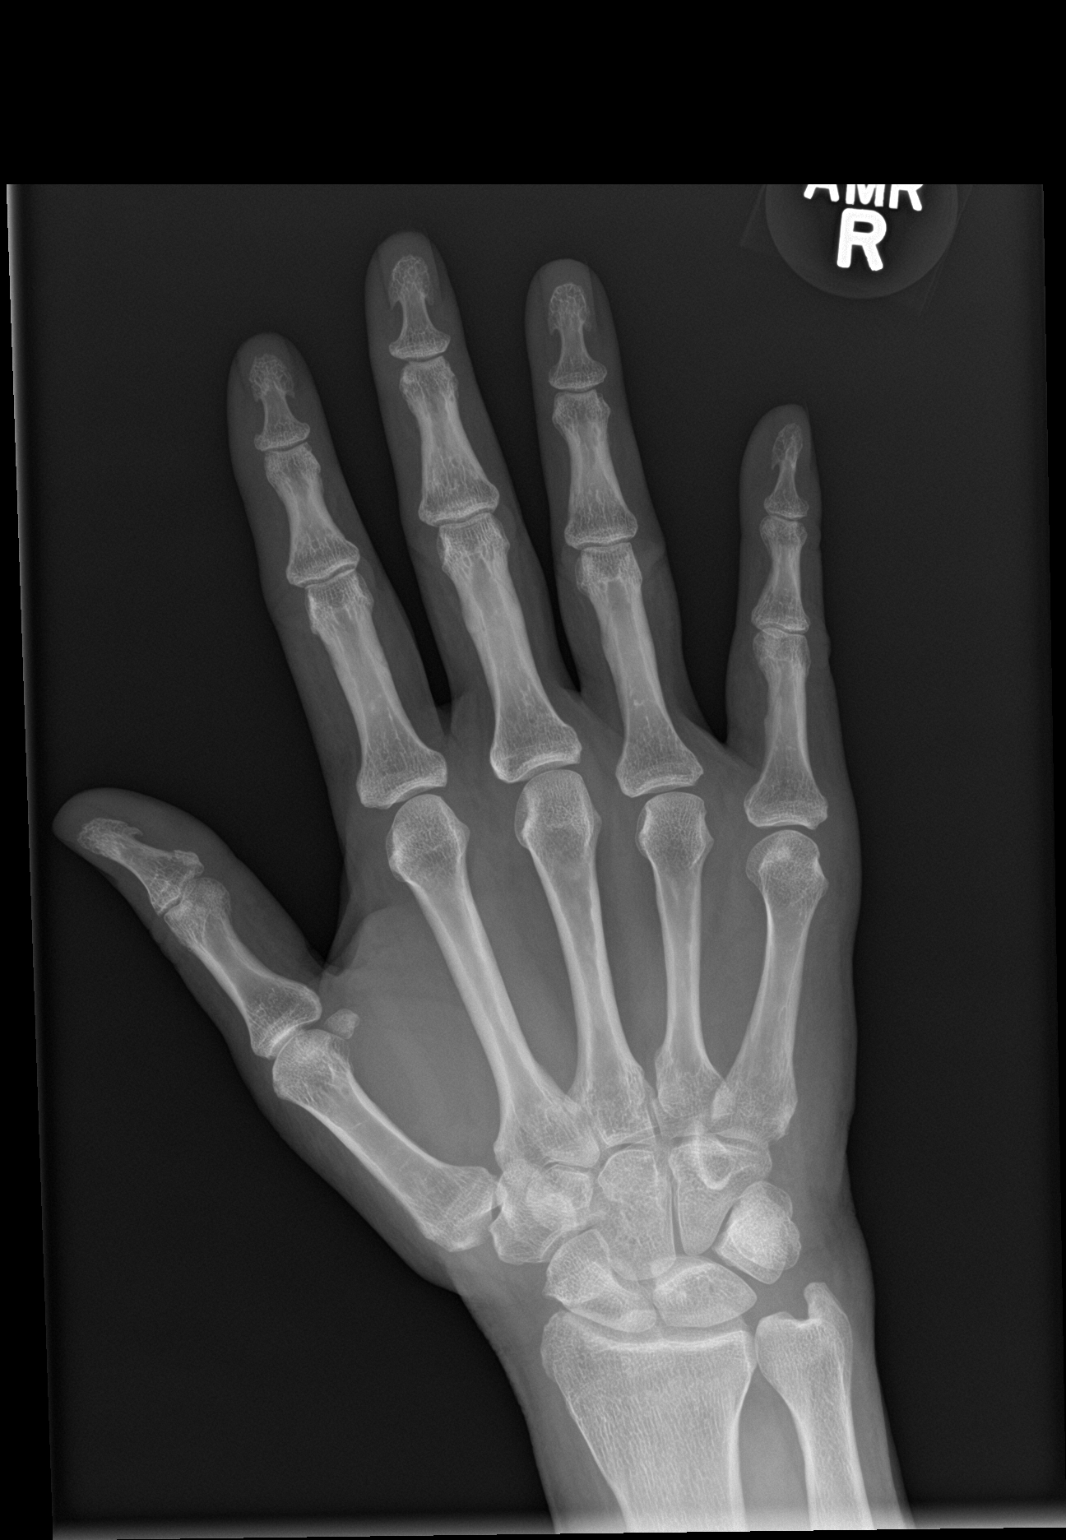

[hand obl]
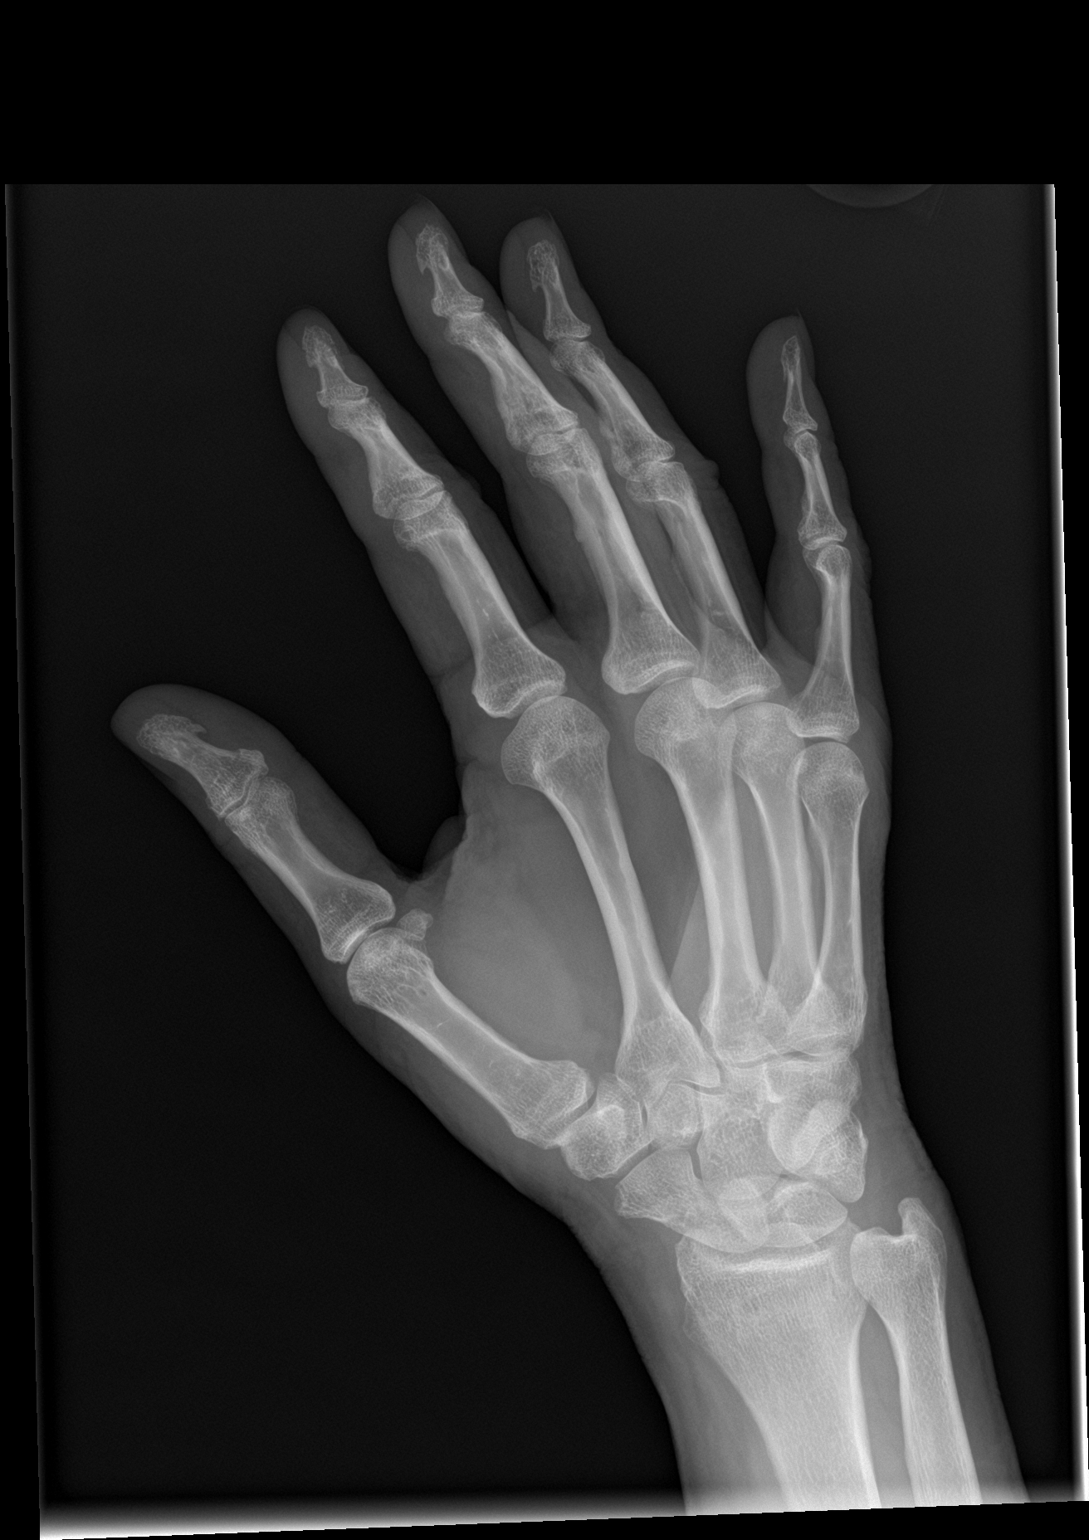

[hand lat]
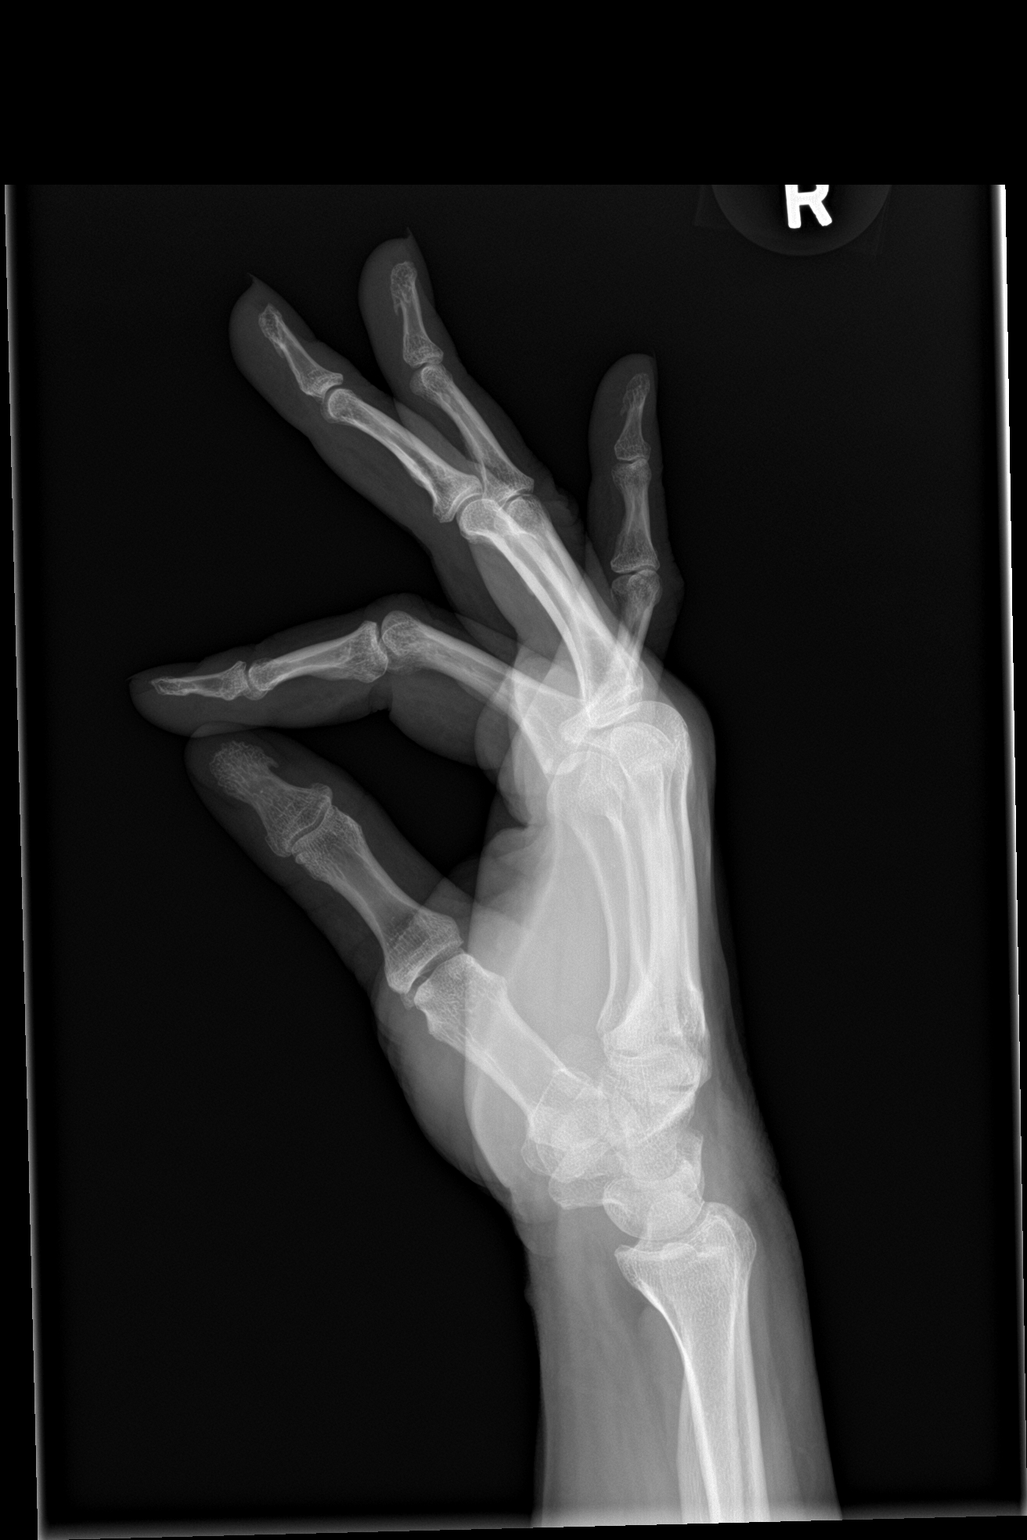

[3 of 3 positions shown; findings below may reference images not displayed]

FINDINGS: No evidence of acute or subacute fracture or dislocation.
Well-preserved joint spaces. Well-preserved bone mineral density. No
intrinsic osseous abnormality.
IMPRESSION: Normal examination.

## 2020-01-11 ENCOUNTER — Ambulatory Visit: Payer: BC Managed Care – PPO | Attending: Internal Medicine

## 2020-01-11 DIAGNOSIS — Z23 Encounter for immunization: Secondary | ICD-10-CM | POA: Insufficient documentation

## 2020-01-11 NOTE — Progress Notes (Signed)
   Covid-19 Vaccination Clinic  Name:  Maureen Mahoney    MRN: [id number] DOB: 1966/05/17  01/11/2020  Maureen Mahoney was observed post Covid-19 immunization for 15 minutes without incidence. She was provided with Vaccine Information Sheet and instruction to access the V-Safe system.   Maureen Mahoney was instructed to call 911 with any severe reactions post vaccine: Marland Kitchen Difficulty breathing  . Swelling of your face and throat  . A fast heartbeat  . A bad rash all over your body  . Dizziness and weakness    Immunizations Administered    Name Date Dose VIS Date Route   Moderna COVID-19 Vaccine 01/11/2020  3:04 PM 0.5 mL 10/14/2019 Intramuscular   Manufacturer: Moderna   Lot: [id number]   [id number]

## 2020-02-14 ENCOUNTER — Ambulatory Visit: Payer: BC Managed Care – PPO | Attending: Internal Medicine

## 2020-02-14 DIAGNOSIS — Z23 Encounter for immunization: Secondary | ICD-10-CM

## 2020-02-14 NOTE — Progress Notes (Signed)
   Covid-19 Vaccination Clinic  Name:  Maureen Mahoney    MRN: [id number] DOB: [birth year]  02/14/2020  [name] was observed post Covid-19 immunization for 15 minutes without incident. She was provided with Vaccine Information Sheet and instruction to access the V-Safe system.   [name] was instructed to call 911 with any severe reactions post vaccine: Marland Kitchen Difficulty breathing  . Swelling of face and throat  . A fast heartbeat  . A bad rash all over body  . Dizziness and weakness   Immunizations Administered    Name Date Dose VIS Date Route   Moderna COVID-19 Vaccine 02/14/2020  2:58 PM 0.5 mL 10/14/2019 Intramuscular   Manufacturer: Moderna   Lot: [id number]   [id number]

## 2020-04-27 ENCOUNTER — Ambulatory Visit: Payer: BC Managed Care – PPO | Admitting: Hematology and Oncology

## 2020-05-09 NOTE — Progress Notes (Signed)
Patient Care Team: Asencion Noble, MD as PCP - General (Internal Medicine) Nicholas Lose, MD as Consulting Physician (Hematology and Oncology) Rolm Bookbinder, MD as Consulting Physician (General Surgery) Gardenia Phlegm, NP as Nurse Practitioner (Hematology and Oncology)  DIAGNOSIS:    ICD-10-CM   1. Malignant neoplasm of upper-outer quadrant of left breast in female, estrogen receptor positive ([place])  C50.412    Z17.0     SUMMARY OF ONCOLOGIC HISTORY: Oncology History  Malignant neoplasm of upper-outer quadrant of left breast in female, estrogen receptor positive ([place])  04/01/2018 Genetic Testing   Genetic testing was negative.  Genes tested include: APC, ATM, AXIN2, BAP1, BARD1, BMPR1A, BRCA1, BRCA2, BRIP1, CDH1, CDK4, CDKN2A (p14ARF), CDKN2A (p16INK4a), CHEK2, CTNNA1, DICER1, EPCAM*, GREM1*, KIT, MEN1, MLH1, MSH2, MSH3, MSH6, MUTYH, NBN, NF1, PALB2, PDGFRA, PMS2, POLD1, POLE, POT1, PTEN, RAD50, RAD51C, RAD51D, RB1, SDHB, SDHC, SDHD, SMAD4, SMARCA4, STK11, TP53, TSC1, TSC2, VHL. The following genes were evaluated for sequence changes only: HOXB13*, MITF*, NTHL1*, SDHA.   05/14/2018 Surgery   Bilateral mastectomies: Left mastectomy: IDC with papillary features grade 2, 1.7 cm, intermediate grade DCIS, LCIS, margins negative, 0/2 lymph nodes negative, ER 100%, PR 100%, Ki-67 10%, HER-2 negative ratio 1.21, T1CN0 stage Ia; Right mastectomy: DCIS intermediate grade 1.2 cm, margins negative, 3 sentinel lymph nodes negative, ER 100%, PR 80%, Tis NX stage 0   05/31/2018 Oncotype testing   Oncotype recurrence score: 0, 3% risk of distant recurrence of 9 years   07/2018 -  Anti-estrogen oral therapy   Tamoxifen daily      CHIEF COMPLIANT: Follow-up of left breast cancer on tamoxifen  INTERVAL HISTORY: Maureen Mahoney is a 54 y.o. with above-mentioned history of left breast cancer and right breast DCIS who underwent bilateral mastectomies with reconstruction and is currently on  anti-estrogen therapy with tamoxifen. She presents to the clinic today for annual follow-up.   She reports no problems or concerns.  She does have occasional night sweats but is able to manage it very well.  It has gotten less intense over time.  Denies any lumps or nodules in the reconstructed breast.  ALLERGIES:  is allergic to penicillins.  MEDICATIONS:  Current Outpatient Medications  Medication Sig Dispense Refill  . ALPRAZolam (XANAX) 0.25 MG tablet Take 0.25 mg by mouth 2 (two) times daily as needed for anxiety.     Marland Kitchen aspirin 81 MG chewable tablet Chew 81 mg by mouth every evening.     Marland Kitchen aspirin-acetaminophen-caffeine (EXCEDRIN MIGRAINE) 250-250-65 MG tablet Take 2 tablets by mouth every 6 (six) hours as needed for headache.    Marland Kitchen atorvastatin (LIPITOR) 10 MG tablet Take 10 mg by mouth daily.    . cetirizine (ZYRTEC) 10 MG tablet Take 10 mg by mouth daily.     Marland Kitchen docusate sodium (COLACE) 100 MG capsule Take 100 mg by mouth daily.    Marland Kitchen escitalopram (LEXAPRO) 10 MG tablet Take 10 mg by mouth daily.    . fluticasone (FLONASE) 50 MCG/ACT nasal spray Place 2 sprays into both nostrils daily as needed for allergies.     Marland Kitchen ibuprofen (ADVIL) 200 MG tablet Take 400 mg by mouth every 6 (six) hours as needed.    . metFORMIN (GLUCOPHAGE) 500 MG tablet Take 500 mg by mouth 2 (two) times daily with a meal.     . methocarbamol (ROBAXIN) 500 MG tablet Take 500 mg by mouth every 8 (eight) hours as needed for muscle spasms.     . Multiple Vitamins-Minerals (  MULTIVITAMIN WITH MINERALS) tablet Take 1 tablet by mouth every evening.     . naproxen sodium (ALEVE) 220 MG tablet Take 440 mg by mouth 2 (two) times daily as needed (pain).    Vladimir Faster Glycol-Propyl Glycol (LUBRICANT EYE DROPS) 0.4-0.3 % SOLN Apply 1 drop to eye.    . tamoxifen (NOLVADEX) 20 MG tablet Take 1 tablet (20 mg total) by mouth daily. (Patient taking differently: Take 20 mg by mouth every evening. ) 90 tablet 3   No current  facility-administered medications for this visit.    PHYSICAL EXAMINATION: ECOG PERFORMANCE STATUS: 1 - Symptomatic but completely ambulatory  There were no vitals filed for this visit. There were no vitals filed for this visit.  BREAST no palpable lumps or nodules in the bilateral reconstructed breasts.. (exam performed in the presence of a chaperone)  LABORATORY DATA:  I have reviewed the data as listed CMP Latest Ref Rng & Units 08/26/2018 06/13/2018 05/07/2018  Glucose 70 - 99 mg/dL 79 86 173(H)  BUN 6 - 20 mg/dL _0 Creatinine 0.44 - 1.00 mg/dL 0.62 0.74 0.73  Sodium 135 - 145 mmol/L 140 139 138  Potassium 3.5 - 5.1 mmol/L 4.2 4.2 3.9  Chloride 98 - 111 mmol/L 108 103 106  CO2 22 - 32 mmol/L 24 25 21(L)  Calcium 8.9 - 10.3 mg/dL 9.1 8.8(L) 8.4(L)    No results found for: WBC, HGB, HCT, MCV, PLT, NEUTROABS  ASSESSMENT & PLAN:  Malignant neoplasm of upper-outer quadrant of left breast in female, estrogen receptor positive ([place]) 05/14/2018:Bilateral mastectomies:  Left mastectomy: IDC with papillary features grade 2, 1.7 cm, intermediate grade DCIS, LCIS, margins negative, 0/2 lymph nodes negative, ER 100%, PR 100%, Ki-67 10%, HER-2 negative ratio 1.21, T1CN0 stage Ia; Right mastectomy: DCIS intermediate grade 1.2 cm, margins negative, 0/3 lymph nodes negative, ER 100%, PR 80%, Tis NX stage 0 Oncotype DX testing on the left breast: Recurrence score 0: 3% risk of distant recurrence of 9 years  Current treatment: Tamoxifen 20 mg daily x5 years started 06/11/2018  Tamoxifen toxicities: No side effects Not had a cycle since July 2019.    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 Chest wall examination: 05/10/2020: Benign  Encouraged patient to exercise. She works [employer]. Return to clinic in 1 year for follow-up.    No orders of the defined types were  placed in this encounter.  The patient has a good understanding of the overall plan. she agrees with it. she will call with any problems that may develop before the next visit here.  Total time spent: 20 mins including face to face time and time spent for planning, charting and coordination of care  Nicholas Lose, MD 05/10/2020  I, Cloyde Reams Dorshimer, am acting as scribe for Dr. Nicholas Lose.  I have reviewed the above documentation for accuracy and completeness, and I agree with the above.

## 2020-05-10 ENCOUNTER — Other Ambulatory Visit: Payer: Self-pay

## 2020-05-10 ENCOUNTER — Inpatient Hospital Stay: Payer: BC Managed Care – PPO | Attending: Hematology and Oncology | Admitting: Hematology and Oncology

## 2020-05-10 DIAGNOSIS — Z7984 Long term (current) use of oral hypoglycemic drugs: Secondary | ICD-10-CM | POA: Insufficient documentation

## 2020-05-10 DIAGNOSIS — Z7982 Long term (current) use of aspirin: Secondary | ICD-10-CM | POA: Diagnosis not present

## 2020-05-10 DIAGNOSIS — C50412 Malignant neoplasm of upper-outer quadrant of left female breast: Secondary | ICD-10-CM | POA: Diagnosis not present

## 2020-05-10 DIAGNOSIS — Z79810 Long term (current) use of selective estrogen receptor modulators (SERMs): Secondary | ICD-10-CM | POA: Diagnosis not present

## 2020-05-10 DIAGNOSIS — Z9013 Acquired absence of bilateral breasts and nipples: Secondary | ICD-10-CM | POA: Diagnosis not present

## 2020-05-10 DIAGNOSIS — Z170 Estrogen receptor positive status [ER+]: Secondary | ICD-10-CM | POA: Diagnosis not present

## 2020-05-10 DIAGNOSIS — Z791 Long term (current) use of non-steroidal anti-inflammatories (NSAID): Secondary | ICD-10-CM | POA: Diagnosis not present

## 2020-05-10 DIAGNOSIS — Z79899 Other long term (current) drug therapy: Secondary | ICD-10-CM | POA: Insufficient documentation

## 2020-05-10 MED ORDER — TAMOXIFEN CITRATE 20 MG PO TABS: 20.0000 mg | ORAL_TABLET | Freq: Every evening | ORAL | 3 refills | Status: DC

## 2020-05-10 NOTE — Assessment & Plan Note (Signed)
05/14/2018:Bilateral mastectomies:  Left mastectomy: IDC with papillary features grade 2, 1.7 cm, intermediate grade DCIS, LCIS, margins negative, 0/2 lymph nodes negative, ER 100%, PR 100%, Ki-67 10%, HER-2 negative ratio 1.21, T1CN0 stage Ia; Right mastectomy: DCIS intermediate grade 1.2 cm, margins negative, 0/3 lymph nodes negative, ER 100%, PR 80%, Tis NX stage 0 Oncotype DX testing on the left breast: Recurrence score 0: 3% risk of distant recurrence of 9 years  Current treatment: Tamoxifen 20 mg daily x5 years started 06/11/2018  Tamoxifen toxicities: No side effects Not had a cycle since July.  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 Chest wall examination: 05/10/2020: Benign  Encouraged patient to exercise. Return to clinic in 1 year for follow-up.

## 2020-09-15 ENCOUNTER — Encounter (INDEPENDENT_AMBULATORY_CARE_PROVIDER_SITE_OTHER): Payer: BC Managed Care – PPO | Admitting: Ophthalmology

## 2020-09-15 ENCOUNTER — Other Ambulatory Visit: Payer: Self-pay

## 2020-09-15 DIAGNOSIS — H3570 Unspecified separation of retinal layers: Secondary | ICD-10-CM | POA: Diagnosis not present

## 2020-09-15 DIAGNOSIS — H33303 Unspecified retinal break, bilateral: Secondary | ICD-10-CM

## 2020-09-15 DIAGNOSIS — H43813 Vitreous degeneration, bilateral: Secondary | ICD-10-CM

## 2020-09-15 DIAGNOSIS — E113292 Type 2 diabetes mellitus with mild nonproliferative diabetic retinopathy without macular edema, left eye: Secondary | ICD-10-CM | POA: Diagnosis not present

## 2020-09-15 DIAGNOSIS — E11319 Type 2 diabetes mellitus with unspecified diabetic retinopathy without macular edema: Secondary | ICD-10-CM | POA: Diagnosis not present

## 2020-10-01 ENCOUNTER — Encounter (INDEPENDENT_AMBULATORY_CARE_PROVIDER_SITE_OTHER): Payer: BC Managed Care – PPO | Admitting: Ophthalmology

## 2020-10-01 ENCOUNTER — Other Ambulatory Visit: Payer: Self-pay

## 2020-10-01 DIAGNOSIS — H33302 Unspecified retinal break, left eye: Secondary | ICD-10-CM | POA: Diagnosis not present

## 2020-10-15 ENCOUNTER — Other Ambulatory Visit: Payer: Self-pay

## 2020-10-15 ENCOUNTER — Encounter (INDEPENDENT_AMBULATORY_CARE_PROVIDER_SITE_OTHER): Payer: BC Managed Care – PPO | Admitting: Ophthalmology

## 2020-10-15 DIAGNOSIS — H33302 Unspecified retinal break, left eye: Secondary | ICD-10-CM

## 2021-04-18 ENCOUNTER — Other Ambulatory Visit: Payer: Self-pay

## 2021-04-18 ENCOUNTER — Encounter (INDEPENDENT_AMBULATORY_CARE_PROVIDER_SITE_OTHER): Payer: BC Managed Care – PPO | Admitting: Ophthalmology

## 2021-04-18 DIAGNOSIS — H3570 Unspecified separation of retinal layers: Secondary | ICD-10-CM

## 2021-04-18 DIAGNOSIS — H33303 Unspecified retinal break, bilateral: Secondary | ICD-10-CM | POA: Diagnosis not present

## 2021-04-18 DIAGNOSIS — E113292 Type 2 diabetes mellitus with mild nonproliferative diabetic retinopathy without macular edema, left eye: Secondary | ICD-10-CM | POA: Diagnosis not present

## 2021-04-18 DIAGNOSIS — H43813 Vitreous degeneration, bilateral: Secondary | ICD-10-CM | POA: Diagnosis not present

## 2021-05-05 ENCOUNTER — Other Ambulatory Visit: Payer: Self-pay | Admitting: Hematology and Oncology

## 2021-05-10 ENCOUNTER — Other Ambulatory Visit: Payer: Self-pay

## 2021-05-10 ENCOUNTER — Telehealth: Payer: Self-pay | Admitting: Hematology and Oncology

## 2021-05-10 ENCOUNTER — Inpatient Hospital Stay: Payer: BC Managed Care – PPO | Attending: Hematology and Oncology | Admitting: Hematology and Oncology

## 2021-05-10 DIAGNOSIS — Z9013 Acquired absence of bilateral breasts and nipples: Secondary | ICD-10-CM | POA: Insufficient documentation

## 2021-05-10 DIAGNOSIS — Z170 Estrogen receptor positive status [ER+]: Secondary | ICD-10-CM | POA: Insufficient documentation

## 2021-05-10 DIAGNOSIS — C50412 Malignant neoplasm of upper-outer quadrant of left female breast: Secondary | ICD-10-CM | POA: Insufficient documentation

## 2021-05-10 DIAGNOSIS — Z79810 Long term (current) use of selective estrogen receptor modulators (SERMs): Secondary | ICD-10-CM | POA: Insufficient documentation

## 2021-05-10 DIAGNOSIS — Z7982 Long term (current) use of aspirin: Secondary | ICD-10-CM | POA: Diagnosis not present

## 2021-05-10 DIAGNOSIS — Z7984 Long term (current) use of oral hypoglycemic drugs: Secondary | ICD-10-CM | POA: Insufficient documentation

## 2021-05-10 DIAGNOSIS — Z79899 Other long term (current) drug therapy: Secondary | ICD-10-CM | POA: Diagnosis not present

## 2021-05-10 NOTE — Assessment & Plan Note (Signed)
05/14/2018:Bilateral mastectomies: Left mastectomy: IDC with papillary features grade 2, 1.7 cm, intermediate grade DCIS, LCIS, margins negative, 0/2 lymph nodes negative, ER 100%, PR 100%, Ki-67 10%, HER-2 negative ratio 1.21, T1CN0 stage Ia; Right mastectomy: DCIS intermediate grade 1.2 cm, margins negative, 0/3 lymph nodes negative, ER 100%, PR 80%, Tis NX stage 0 Oncotype DX testing on the left breast: Recurrence score 0: 3% risk of distant recurrence of 9 years  Current treatment: Tamoxifen 20 mg daily x5 years started 06/11/2018  Tamoxifen toxicities:No side effects Not had a cycle since July 2019.   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 Chest wall examination: 05/10/2021: Benign  Encouraged patient to exercise. She works [employer]. Return to clinic in 1 year for follow-up.

## 2021-05-10 NOTE — Telephone Encounter (Signed)
Scheduled appointment per 06/28 los. Patient is aware.

## 2021-05-10 NOTE — Progress Notes (Signed)
Patient Care Team: Asencion Noble, MD as PCP - General (Internal Medicine) Nicholas Lose, MD as Consulting Physician (Hematology and Oncology) Rolm Bookbinder, MD as Consulting Physician (General Surgery) Delice Bison Charlestine Massed, NP as Nurse Practitioner (Hematology and Oncology)  DIAGNOSIS:  Encounter Diagnosis  Name Primary?   Malignant neoplasm of upper-outer quadrant of left breast in female, estrogen receptor positive ([place])     SUMMARY OF ONCOLOGIC HISTORY: Oncology History  Malignant neoplasm of upper-outer quadrant of left breast in female, estrogen receptor positive ([place])  04/01/2018 Genetic Testing   Genetic testing was negative.  Genes tested include: APC, ATM, AXIN2, BAP1, BARD1, BMPR1A, BRCA1, BRCA2, BRIP1, CDH1, CDK4, CDKN2A (p14ARF), CDKN2A (p16INK4a), CHEK2, CTNNA1, DICER1, EPCAM*, GREM1*, KIT, MEN1, MLH1, MSH2, MSH3, MSH6, MUTYH, NBN, NF1, PALB2, PDGFRA, PMS2, POLD1, POLE, POT1, PTEN, RAD50, RAD51C, RAD51D, RB1, SDHB, SDHC, SDHD, SMAD4, SMARCA4, STK11, TP53, TSC1, TSC2, VHL. The following genes were evaluated for sequence changes only: HOXB13*, MITF*, NTHL1*, SDHA.    05/14/2018 Surgery   Bilateral mastectomies: Left mastectomy: IDC with papillary features grade 2, 1.7 cm, intermediate grade DCIS, LCIS, margins negative, 0/2 lymph nodes negative, ER 100%, PR 100%, Ki-67 10%, HER-2 negative ratio 1.21, T1CN0 stage Ia; Right mastectomy: DCIS intermediate grade 1.2 cm, margins negative, 3 sentinel lymph nodes negative, ER 100%, PR 80%, Tis NX stage 0    05/31/2018 Oncotype testing   Oncotype recurrence score: 0, 3% risk of distant recurrence of 9 years    07/2018 -  Anti-estrogen oral therapy   Tamoxifen daily       CHIEF COMPLIANT: Follow-up on tamoxifen therapy  INTERVAL HISTORY: Maureen Mahoney is a 55 year old above-mentioned for left breast cancer treated with bilateral mastectomies.  She did not require chemotherapy.  She is currently on antiestrogen  therapy with tamoxifen.  She is tolerating tamoxifen extremely well.  She has very occasional hot flashes.  Denies any lumps or nodules in the chest wall or bilateral reconstructed breasts.  ALLERGIES:  is allergic to penicillins.  MEDICATIONS:  Current Outpatient Medications  Medication Sig Dispense Refill   ALPRAZolam (XANAX) 0.25 MG tablet Take 0.25 mg by mouth 2 (two) times daily as needed for anxiety.      aspirin 81 MG chewable tablet Chew 81 mg by mouth every evening.      aspirin-acetaminophen-caffeine (EXCEDRIN MIGRAINE) 250-250-65 MG tablet Take 2 tablets by mouth every 6 (six) hours as needed for headache.     atorvastatin (LIPITOR) 10 MG tablet Take 10 mg by mouth daily.     cetirizine (ZYRTEC) 10 MG tablet Take 10 mg by mouth daily.      docusate sodium (COLACE) 100 MG capsule Take 100 mg by mouth daily.     escitalopram (LEXAPRO) 10 MG tablet Take 10 mg by mouth daily.     fluticasone (FLONASE) 50 MCG/ACT nasal spray Place 2 sprays into both nostrils daily as needed for allergies.      ibuprofen (ADVIL) 200 MG tablet Take 400 mg by mouth every 6 (six) hours as needed.     metFORMIN (GLUCOPHAGE) 500 MG tablet Take 500 mg by mouth 2 (two) times daily with a meal.      methocarbamol (ROBAXIN) 500 MG tablet Take 500 mg by mouth every 8 (eight) hours as needed for muscle spasms.      Multiple Vitamins-Minerals (MULTIVITAMIN WITH MINERALS) tablet Take 1 tablet by mouth every evening.      naproxen sodium (ALEVE) 220 MG tablet Take 440 mg by mouth 2 (  two) times daily as needed (pain).     Polyethyl Glycol-Propyl Glycol (LUBRICANT EYE DROPS) 0.4-0.3 % SOLN Apply 1 drop to eye.     tamoxifen (NOLVADEX) 20 MG tablet TAKE 1 TABLET BY MOUTH EVERY EVENING 90 tablet 3   No current facility-administered medications for this visit.    PHYSICAL EXAMINATION: ECOG PERFORMANCE STATUS: 0 - Asymptomatic  Vitals:   05/10/21 1339  BP: 133/75  Pulse: 80  Resp: 18  Temp: (!) 97.4 F (36.3 C)   SpO2: 97%   Filed Weights   05/10/21 1339  Weight: 198 lb 12.8 oz (90.2 kg)    BREAST: Bilateral mastectomies with reconstruction.  No palpable lumps or nodules (exam performed in the presence of a chaperone)  LABORATORY DATA:  I have reviewed the data as listed CMP Latest Ref Rng & Units 08/26/2018 06/13/2018 05/07/2018  Glucose 70 - 99 mg/dL 79 86 173(H)  BUN 6 - 20 mg/dL 11 9 7  Creatinine 0.44 - 1.00 mg/dL 0.62 0.74 0.73  Sodium 135 - 145 mmol/L 140 139 138  Potassium 3.5 - 5.1 mmol/L 4.2 4.2 3.9  Chloride 98 - 111 mmol/L 108 103 106  CO2 22 - 32 mmol/L 24 25 21(L)  Calcium 8.9 - 10.3 mg/dL 9.1 8.8(L) 8.4(L)    No results found for: WBC, HGB, HCT, MCV, PLT, NEUTROABS  ASSESSMENT & PLAN:  Malignant neoplasm of upper-outer quadrant of left breast in female, estrogen receptor positive ([facility]) 05/14/2018: Bilateral mastectomies: Left mastectomy: IDC with papillary features grade 2, 1.7 cm, intermediate grade DCIS, LCIS, margins negative, 0/2 lymph nodes negative, ER 100%, PR 100%, Ki-67 10%, HER-2 negative ratio 1.21, T1CN0 stage Ia; Right mastectomy: DCIS intermediate grade 1.2 cm, margins negative, 0/3 lymph nodes negative, ER 100%, PR 80%, Tis NX stage 0 Oncotype DX testing on the left breast: Recurrence score 0: 3% risk of distant recurrence of 9 years   Current treatment: Tamoxifen 20 mg daily x5 years started 06/11/2018   Tamoxifen toxicities: No side effects Not had a cycle since July 2019.    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 Chest wall examination: 05/10/2021: Benign She is excited that she plans to go on medication to [place] and [place] area over the next week or 2.  She works [employer]. Return to clinic in 1 year for follow-up.   No orders of the defined types were placed in this encounter.  The patient has a good understanding of the  overall plan. she agrees with it. she will call with any problems that may develop before the next visit here. Total time spent: 30 mins including face to face time and time spent for planning, charting and co-ordination of care   Viinay K Gudena, MD 05/10/21     

## 2022-04-17 ENCOUNTER — Telehealth: Payer: Self-pay | Admitting: Hematology and Oncology

## 2022-04-17 NOTE — Telephone Encounter (Signed)
Rescheduled appointment per provider PAL. Left message. 

## 2022-04-21 ENCOUNTER — Encounter (INDEPENDENT_AMBULATORY_CARE_PROVIDER_SITE_OTHER): Payer: BC Managed Care – PPO | Admitting: Ophthalmology

## 2022-04-21 DIAGNOSIS — H43813 Vitreous degeneration, bilateral: Secondary | ICD-10-CM | POA: Diagnosis not present

## 2022-04-21 DIAGNOSIS — E113392 Type 2 diabetes mellitus with moderate nonproliferative diabetic retinopathy without macular edema, left eye: Secondary | ICD-10-CM | POA: Diagnosis not present

## 2022-04-21 DIAGNOSIS — H3570 Unspecified separation of retinal layers: Secondary | ICD-10-CM | POA: Diagnosis not present

## 2022-04-21 DIAGNOSIS — H33303 Unspecified retinal break, bilateral: Secondary | ICD-10-CM

## 2022-05-08 ENCOUNTER — Ambulatory Visit: Payer: BC Managed Care – PPO | Admitting: Hematology and Oncology

## 2022-05-22 ENCOUNTER — Other Ambulatory Visit: Payer: Self-pay | Admitting: Hematology and Oncology

## 2022-06-01 ENCOUNTER — Encounter: Payer: Self-pay | Admitting: *Deleted

## 2022-06-01 ENCOUNTER — Inpatient Hospital Stay: Payer: BC Managed Care – PPO | Attending: Hematology and Oncology | Admitting: Hematology and Oncology

## 2022-06-01 ENCOUNTER — Other Ambulatory Visit: Payer: Self-pay

## 2022-06-01 DIAGNOSIS — Z170 Estrogen receptor positive status [ER+]: Secondary | ICD-10-CM | POA: Insufficient documentation

## 2022-06-01 DIAGNOSIS — C50412 Malignant neoplasm of upper-outer quadrant of left female breast: Secondary | ICD-10-CM | POA: Insufficient documentation

## 2022-06-01 DIAGNOSIS — Z79810 Long term (current) use of selective estrogen receptor modulators (SERMs): Secondary | ICD-10-CM | POA: Diagnosis not present

## 2022-06-01 DIAGNOSIS — Z79899 Other long term (current) drug therapy: Secondary | ICD-10-CM | POA: Insufficient documentation

## 2022-06-01 MED ORDER — FAMOTIDINE 20 MG PO TABS: 20.0000 mg | ORAL_TABLET | Freq: Two times a day (BID) | ORAL | Status: DC

## 2022-06-01 MED ORDER — FEXOFENADINE HCL 60 MG PO TABS: 60.0000 mg | ORAL_TABLET | Freq: Two times a day (BID) | ORAL | Status: DC

## 2022-06-01 MED ORDER — LETROZOLE 2.5 MG PO TABS: 2.5000 mg | ORAL_TABLET | Freq: Every day | ORAL | 3 refills | Status: DC

## 2022-06-01 MED ORDER — DOCUSATE SODIUM 100 MG PO CAPS: 100.0000 mg | ORAL_CAPSULE | Freq: Two times a day (BID) | ORAL | Status: AC

## 2022-06-01 MED ORDER — VITAMIN B-12 1000 MCG PO TABS: 1000.0000 ug | ORAL_TABLET | Freq: Every day | ORAL | Status: AC

## 2022-06-01 NOTE — Progress Notes (Signed)
Per MD request RN successfully faxed [facility] testing request [phone]).

## 2022-06-01 NOTE — Progress Notes (Signed)
Patient Care Team: Asencion Noble, MD as PCP - General (Internal Medicine) Nicholas Lose, MD as Consulting Physician (Hematology and Oncology) Rolm Bookbinder, MD as Consulting Physician (General Surgery) Delice Bison Charlestine Massed, NP as Nurse Practitioner (Hematology and Oncology)  DIAGNOSIS:  Encounter Diagnosis  Name Primary?   Malignant neoplasm of upper-outer quadrant of left breast in female, estrogen receptor positive ([place])     SUMMARY OF ONCOLOGIC HISTORY: Oncology History  Malignant neoplasm of upper-outer quadrant of left breast in female, estrogen receptor positive ([place])  04/01/2018 Genetic Testing   Genetic testing was negative.  Genes tested include: APC, ATM, AXIN2, BAP1, BARD1, BMPR1A, BRCA1, BRCA2, BRIP1, CDH1, CDK4, CDKN2A (p14ARF), CDKN2A (p16INK4a), CHEK2, CTNNA1, DICER1, EPCAM*, GREM1*, KIT, MEN1, MLH1, MSH2, MSH3, MSH6, MUTYH, NBN, NF1, PALB2, PDGFRA, PMS2, POLD1, POLE, POT1, PTEN, RAD50, RAD51C, RAD51D, RB1, SDHB, SDHC, SDHD, SMAD4, SMARCA4, STK11, TP53, TSC1, TSC2, VHL. The following genes were evaluated for sequence changes only: HOXB13*, MITF*, NTHL1*, SDHA.   05/14/2018 Surgery   Bilateral mastectomies: Left mastectomy: IDC with papillary features grade 2, 1.7 cm, intermediate grade DCIS, LCIS, margins negative, 0/2 lymph nodes negative, ER 100%, PR 100%, Ki-67 10%, HER-2 negative ratio 1.21, T1CN0 stage Ia; Right mastectomy: DCIS intermediate grade 1.2 cm, margins negative, 3 sentinel lymph nodes negative, ER 100%, PR 80%, Tis NX stage 0   05/31/2018 Oncotype testing   Oncotype recurrence score: 0, 3% risk of distant recurrence of 9 years   07/2018 -  Anti-estrogen oral therapy   Tamoxifen daily      CHIEF COMPLIANT: Follow-up on tamoxifen therapy    INTERVAL HISTORY: Maureen Mahoney is a 56 year old above-mentioned for left breast cancer currently on tamoxifen. She presents to the clinic today for a follow-up. She states that she is tolerating the  tamoxifen. Denies breast pain but some discomfort from the scar tissue. She has been having some indigestion and having problems with certain things that she eats.   ALLERGIES:  is allergic to penicillins.  MEDICATIONS:  Current Outpatient Medications  Medication Sig Dispense Refill   famotidine (PEPCID) 20 MG tablet Take 1 tablet (20 mg total) by mouth 2 (two) times daily.     fexofenadine (ALLEGRA ALLERGY) 60 MG tablet Take 1 tablet (60 mg total) by mouth 2 (two) times daily.     letrozole (FEMARA) 2.5 MG tablet Take 1 tablet (2.5 mg total) by mouth daily. 90 tablet 3   vitamin B-12 (CYANOCOBALAMIN) 1000 MCG tablet Take 1 tablet (1,000 mcg total) by mouth daily.     ALPRAZolam (XANAX) 0.25 MG tablet Take 0.25 mg by mouth 2 (two) times daily as needed for anxiety.      aspirin 81 MG chewable tablet Chew 81 mg by mouth every evening.      aspirin-acetaminophen-caffeine (EXCEDRIN MIGRAINE) 250-250-65 MG tablet Take 2 tablets by mouth every 6 (six) hours as needed for headache.     atorvastatin (LIPITOR) 10 MG tablet Take 10 mg by mouth daily.     docusate sodium (COLACE) 100 MG capsule Take 1 capsule (100 mg total) by mouth 2 (two) times daily. 10 capsule    escitalopram (LEXAPRO) 10 MG tablet Take 10 mg by mouth daily.     fluticasone (FLONASE) 50 MCG/ACT nasal spray Place 2 sprays into both nostrils daily as needed for allergies.      ibuprofen (ADVIL) 200 MG tablet Take 400 mg by mouth every 6 (six) hours as needed.     metFORMIN (GLUCOPHAGE) 500 MG tablet Take 500  mg by mouth 2 (two) times daily with a meal.      Multiple Vitamins-Minerals (MULTIVITAMIN WITH MINERALS) tablet Take 1 tablet by mouth every evening.      Polyethyl Glycol-Propyl Glycol (LUBRICANT EYE DROPS) 0.4-0.3 % SOLN Apply 1 drop to eye.     No current facility-administered medications for this visit.    PHYSICAL EXAMINATION: ECOG PERFORMANCE STATUS: 1 - Symptomatic but completely ambulatory  Vitals:   06/01/22 1449   BP: 123/69  Pulse: 72  Resp: 18  Temp: (!) 97.2 F (36.2 C)  SpO2: 99%   Filed Weights   06/01/22 1449  Weight: 200 lb 9.6 oz (91 kg)      LABORATORY DATA:  I have reviewed the data as listed    Latest Ref Rng & Units 08/26/2018    1:37 PM 06/13/2018    3:09 PM 05/07/2018   12:13 PM  CMP  Glucose 70 - 99 mg/dL 79  86  173   BUN 6 - 20 mg/dL 11  9  7    Creatinine 0.44 - 1.00 mg/dL 0.62  0.74  0.73   Sodium 135 - 145 mmol/L 140  139  138   Potassium 3.5 - 5.1 mmol/L 4.2  4.2  3.9   Chloride 98 - 111 mmol/L 108  103  106   CO2 22 - 32 mmol/L 24  25  21    Calcium 8.9 - 10.3 mg/dL 9.1  8.8  8.4     No results found for: "WBC", "HGB", "HCT", "MCV", "PLT", "NEUTROABS"  ASSESSMENT & PLAN:  Malignant neoplasm of upper-outer quadrant of left breast in female, estrogen receptor positive ([place]) 05/14/2018: Bilateral mastectomies: Left mastectomy: IDC with papillary features grade 2, 1.7 cm, intermediate grade DCIS, LCIS, margins negative, 0/2 lymph nodes negative, ER 100%, PR 100%, Ki-67 10%, HER-2 negative ratio 1.21, T1CN0 stage Ia; Right mastectomy: DCIS intermediate grade 1.2 cm, margins negative, 0/3 lymph nodes negative, ER 100%, PR 80%, Tis NX stage 0 Oncotype DX testing on the left breast: Recurrence score 0: 3% risk of distant recurrence of 9 years   Current treatment: Tamoxifen 20 mg daily x5 years started 06/11/2018   Tamoxifen toxicities: No side effects Recommended switching from tamoxifen to letrozole  Letrozole counseling: We discussed the risks and benefits of anti-estrogen therapy with aromatase inhibitors. These include but not limited to insomnia, hot flashes, mood changes, vaginal dryness, bone density loss, and weight gain. We strongly believe that the benefits far outweigh the risks. Patient understands these risks and consented to starting treatment.    We will obtain breast cancer index to determine duration of antiestrogen therapy.  I will call her with the  results of [facility].  We also briefly talked about [employer] testing.  She will study about it and will let us know if she wants [place] to run it.     She works as parent Oceanographer at schools  Breast cancer surveillance: No role of mammogram since she had bilateral mastectomies.  .   She works [employer]. Return to clinic in 1 year for follow-up.      No orders of the defined types were placed in this encounter.  The patient has a good understanding of the overall plan. she agrees with it. she will call with any problems that may develop before the next visit here. Total time spent: 30 mins including face to face time and time spent for planning, charting and co-ordination of care   [place]  Loyal Gambler, MD 06/01/22    I Gardiner Coins am scribing for Dr. Lindi Adie  I have reviewed the above documentation for accuracy and completeness, and I agree with the above.

## 2022-06-01 NOTE — Assessment & Plan Note (Addendum)
05/14/2018:Bilateral mastectomies: Left mastectomy: IDC with papillary features grade 2, 1.7 cm, intermediate grade DCIS, LCIS, margins negative, 0/2 lymph nodes negative, ER 100%, PR 100%, Ki-67 10%, HER-2 negative ratio 1.21, T1CN0 stage Ia; Right mastectomy: DCIS intermediate grade 1.2 cm, margins negative, 0/3 lymph nodes negative, ER 100%, PR 80%, Tis NX stage 0 Oncotype DX testing on the left breast: Recurrence score 0: 3% risk of distant recurrence of 9 years  Current treatment: Tamoxifen 20 mg daily x5 years started 06/11/2018  Tamoxifen toxicities:No side effects Recommended switching from tamoxifen to letrozole  Letrozole counseling: We discussed the risks and benefits of anti-estrogen therapy with aromatase inhibitors. These include but not limited to insomnia, hot flashes, mood changes, vaginal dryness, bone density loss, and weight gain. We strongly believe that the benefits far outweigh the risks. Patient understands these risks and consented to starting treatment.    We will obtain breast cancer index to determine duration of antiestrogen therapy.  I will call her with the results of [facility].  We also briefly talked about [employer] testing.  She will study about it and will let us know if she wants [place] to run it.   She works as parent Oceanographer at schools  Breast cancer surveillance: No role of mammogram since she had bilateral mastectomies.  .  She works [employer]. Return to clinic in 1 year for follow-up.

## 2022-06-21 ENCOUNTER — Telehealth: Payer: Self-pay | Admitting: *Deleted

## 2022-06-21 NOTE — Telephone Encounter (Signed)
Per MD request, RN placed call to pt regarding recent [facility] testing results showing no indication of extended endocrine therapy after 5 years.  Pt educated that she will complete Letrozole 06/12/2023.  Pt verbalized understanding.

## 2022-06-22 ENCOUNTER — Encounter (HOSPITAL_COMMUNITY): Payer: Self-pay

## 2022-06-29 ENCOUNTER — Other Ambulatory Visit: Payer: Self-pay

## 2022-06-29 ENCOUNTER — Telehealth: Payer: Self-pay | Admitting: *Deleted

## 2022-06-29 ENCOUNTER — Inpatient Hospital Stay: Payer: BC Managed Care – PPO | Attending: Hematology and Oncology | Admitting: Hematology and Oncology

## 2022-06-29 DIAGNOSIS — R21 Rash and other nonspecific skin eruption: Secondary | ICD-10-CM | POA: Diagnosis present

## 2022-06-29 DIAGNOSIS — Z79811 Long term (current) use of aromatase inhibitors: Secondary | ICD-10-CM | POA: Insufficient documentation

## 2022-06-29 DIAGNOSIS — Z9013 Acquired absence of bilateral breasts and nipples: Secondary | ICD-10-CM | POA: Insufficient documentation

## 2022-06-29 DIAGNOSIS — C50412 Malignant neoplasm of upper-outer quadrant of left female breast: Secondary | ICD-10-CM

## 2022-06-29 DIAGNOSIS — Z170 Estrogen receptor positive status [ER+]: Secondary | ICD-10-CM

## 2022-06-29 DIAGNOSIS — Z853 Personal history of malignant neoplasm of breast: Secondary | ICD-10-CM | POA: Diagnosis not present

## 2022-06-29 MED ORDER — TAMOXIFEN CITRATE 20 MG PO TABS: 20.0000 mg | ORAL_TABLET | Freq: Every evening | ORAL | 3 refills | Status: DC

## 2022-06-29 NOTE — Progress Notes (Signed)
Patient Care Team: Asencion Noble, MD as PCP - General (Internal Medicine) Nicholas Lose, MD as Consulting Physician (Hematology and Oncology) Rolm Bookbinder, MD as Consulting Physician (General Surgery) Delice Bison Charlestine Massed, NP as Nurse Practitioner (Hematology and Oncology)  DIAGNOSIS:  Encounter Diagnosis  Name Primary?   Malignant neoplasm of upper-outer quadrant of left breast in female, estrogen receptor positive ([place])     SUMMARY OF ONCOLOGIC HISTORY: Oncology History  Malignant neoplasm of upper-outer quadrant of left breast in female, estrogen receptor positive ([place])  04/01/2018 Genetic Testing   Genetic testing was negative.  Genes tested include: APC, ATM, AXIN2, BAP1, BARD1, BMPR1A, BRCA1, BRCA2, BRIP1, CDH1, CDK4, CDKN2A (p14ARF), CDKN2A (p16INK4a), CHEK2, CTNNA1, DICER1, EPCAM*, GREM1*, KIT, MEN1, MLH1, MSH2, MSH3, MSH6, MUTYH, NBN, NF1, PALB2, PDGFRA, PMS2, POLD1, POLE, POT1, PTEN, RAD50, RAD51C, RAD51D, RB1, SDHB, SDHC, SDHD, SMAD4, SMARCA4, STK11, TP53, TSC1, TSC2, VHL. The following genes were evaluated for sequence changes only: HOXB13*, MITF*, NTHL1*, SDHA.   05/14/2018 Surgery   Bilateral mastectomies: Left mastectomy: IDC with papillary features grade 2, 1.7 cm, intermediate grade DCIS, LCIS, margins negative, 0/2 lymph nodes negative, ER 100%, PR 100%, Ki-67 10%, HER-2 negative ratio 1.21, T1CN0 stage Ia; Right mastectomy: DCIS intermediate grade 1.2 cm, margins negative, 3 sentinel lymph nodes negative, ER 100%, PR 80%, Tis NX stage 0   05/31/2018 Oncotype testing   Oncotype recurrence score: 0, 3% risk of distant recurrence of 9 years   07/2018 -  Anti-estrogen oral therapy   Tamoxifen daily      CHIEF COMPLIANT: Follow-up to discuss rash from Letrozole  INTERVAL HISTORY: Maureen Mahoney is a 56 y.o is here to discuss rash from taking letrozole. She states that she took the first pill Friday and Sunday she noticed the rash on her stomach look like  bites and then on Monday she notice it on her thighs. Look puffy and pink and white. Seems like it is getting better because it's not getting worst. The thighs is worst than the abdominal.    ALLERGIES:  is allergic to penicillins.  MEDICATIONS:  Current Outpatient Medications  Medication Sig Dispense Refill   ALPRAZolam (XANAX) 0.25 MG tablet Take 0.25 mg by mouth 2 (two) times daily as needed for anxiety.      aspirin 81 MG chewable tablet Chew 81 mg by mouth every evening.      aspirin-acetaminophen-caffeine (EXCEDRIN MIGRAINE) 250-250-65 MG tablet Take 2 tablets by mouth every 6 (six) hours as needed for headache.     atorvastatin (LIPITOR) 10 MG tablet Take 10 mg by mouth daily.     docusate sodium (COLACE) 100 MG capsule Take 1 capsule (100 mg total) by mouth 2 (two) times daily. 10 capsule    escitalopram (LEXAPRO) 10 MG tablet Take 10 mg by mouth daily.     famotidine (PEPCID) 20 MG tablet Take 1 tablet (20 mg total) by mouth 2 (two) times daily.     fexofenadine (ALLEGRA ALLERGY) 60 MG tablet Take 1 tablet (60 mg total) by mouth 2 (two) times daily.     fluticasone (FLONASE) 50 MCG/ACT nasal spray Place 2 sprays into both nostrils daily as needed for allergies.      ibuprofen (ADVIL) 200 MG tablet Take 400 mg by mouth every 6 (six) hours as needed.     metFORMIN (GLUCOPHAGE) 500 MG tablet Take 500 mg by mouth 2 (two) times daily with a meal.      Multiple Vitamins-Minerals (MULTIVITAMIN WITH MINERALS) tablet Take 1  tablet by mouth every evening.      Polyethyl Glycol-Propyl Glycol (LUBRICANT EYE DROPS) 0.4-0.3 % SOLN Apply 1 drop to eye.     tamoxifen (NOLVADEX) 20 MG tablet Take 1 tablet (20 mg total) by mouth every evening. 90 tablet 3   vitamin B-12 (CYANOCOBALAMIN) 1000 MCG tablet Take 1 tablet (1,000 mcg total) by mouth daily.     No current facility-administered medications for this visit.    PHYSICAL EXAMINATION: ECOG PERFORMANCE STATUS: 1 - Symptomatic but completely  ambulatory  Vitals:   06/29/22 1427  BP: 139/75  Pulse: 86  Resp: 18  Temp: (!) 97.5 F (36.4 C)  SpO2: 96%   Filed Weights   06/29/22 1427  Weight: 200 lb (90.7 kg)      LABORATORY DATA:  I have reviewed the data as listed    Latest Ref Rng & Units 08/26/2018    1:37 PM 06/13/2018    3:09 PM 05/07/2018   12:13 PM  CMP  Glucose 70 - 99 mg/dL 79  86  173   BUN 6 - 20 mg/dL _0 Creatinine 0.44 - 1.00 mg/dL 0.62  0.74  0.73   Sodium 135 - 145 mmol/L 140  139  138   Potassium 3.5 - 5.1 mmol/L 4.2  4.2  3.9   Chloride 98 - 111 mmol/L 108  103  106   CO2 22 - 32 mmol/L _1 Calcium 8.9 - 10.3 mg/dL 9.1  8.8  8.4      No results found for: "WBC", "HGB", "HCT", "MCV", "PLT", "NEUTROABS"  ASSESSMENT & PLAN:  Malignant neoplasm of upper-outer quadrant of left breast in female, estrogen receptor positive ([place]) 05/14/2018: Bilateral mastectomies: Left mastectomy: IDC with papillary features grade 2, 1.7 cm, intermediate grade DCIS, LCIS, margins negative, 0/2 lymph nodes negative, ER 100%, PR 100%, Ki-67 10%, HER-2 negative ratio 1.21, T1CN0 stage Ia; Right mastectomy: DCIS intermediate grade 1.2 cm, margins negative, 0/3 lymph nodes negative, ER 100%, PR 80%, Tis NX stage 0 Oncotype DX testing on the left breast: Recurrence score 0: 3% risk of distant recurrence of 9 years   Current treatment: Tamoxifen 20 mg daily x5 years started 06/11/2018 switched to letrozole in July 2023 (discontinued letrozole because of maculopapular rash) restarted tamoxifen 06/29/2022 to complete 5 years by August 2024  Breast cancer index: No benefit from extended adjuvant therapy   Letrozole toxicities: Maculopapular rash.  We discontinued letrozole and sent a new prescription for tamoxifen to complete 1 more year  Return to clinic in 1 year for follow-up    No orders of the defined types were placed in this encounter.  The patient has a good understanding of the overall plan. she  agrees with it. she will call with any problems that may develop before the next visit here. Total time spent: 30 mins including face to face time and time spent for planning, charting and co-ordination of care   Harriette Ohara, MD 06/29/22    I Gardiner Coins am scribing for Dr. Lindi Adie  I have reviewed the above documentation for accuracy and completeness, and I agree with the above.

## 2022-06-29 NOTE — Assessment & Plan Note (Signed)
05/14/2018:Bilateral mastectomies: Left mastectomy: IDC with papillary features grade 2, 1.7 cm, intermediate grade DCIS, LCIS, margins negative, 0/2 lymph nodes negative, ER 100%, PR 100%, Ki-67 10%, HER-2 negative ratio 1.21, T1CN0 stage Ia; Right mastectomy: DCIS intermediate grade 1.2 cm, margins negative, 0/3 lymph nodes negative, ER 100%, PR 80%, Tis NX stage 0 Oncotype DX testing on the left breast: Recurrence score 0: 3% risk of distant recurrence of 9 years  Current treatment: Tamoxifen 20 mg daily x5 years started 06/11/2018 switched to letrozole in July 2023 (discontinued letrozole because of maculopapular rash) restarted tamoxifen 06/29/2022 to complete 5 years by August 2024  Breast cancer index: No benefit from extended adjuvant therapy  Letrozole toxicities: Maculopapular rash.  We discontinued letrozole and sent a new prescription for tamoxifen to complete 1 more year  Return to clinic in 1 year for follow-up

## 2022-06-29 NOTE — Telephone Encounter (Signed)
Received call from pt with complaint of rash since starting Letrozole over the weekend.  Pt requesting office visit for MD to evaluate and discuss change in AI therapy. Appt scheduled and pt verbalized understanding.

## 2022-09-29 ENCOUNTER — Encounter: Payer: Self-pay | Admitting: *Deleted

## 2023-04-20 ENCOUNTER — Ambulatory Visit: Payer: BC Managed Care – PPO | Admitting: Gastroenterology

## 2023-04-20 ENCOUNTER — Encounter: Payer: Self-pay | Admitting: Gastroenterology

## 2023-04-20 VITALS — BP 125/79 | HR 93 | Temp 98.1°F | Ht 65.0 in | Wt 198.0 lb

## 2023-04-20 DIAGNOSIS — Z86010 Personal history of colonic polyps: Secondary | ICD-10-CM

## 2023-04-20 DIAGNOSIS — R140 Abdominal distension (gaseous): Secondary | ICD-10-CM | POA: Insufficient documentation

## 2023-04-20 DIAGNOSIS — R143 Flatulence: Secondary | ICD-10-CM

## 2023-04-20 DIAGNOSIS — K5900 Constipation, unspecified: Secondary | ICD-10-CM

## 2023-04-20 NOTE — Progress Notes (Signed)
GI Office Note    Referring Provider: Carylon Perches, MD Primary Care Physician:  Carylon Perches, MD  Primary Gastroenterologist: Hennie Duos. Marletta Lor, DO   Chief Complaint   Chief Complaint  Patient presents with   Colonoscopy   Constipation    Takes two colace daily. Also has issues with bloating and gas.     History of Present Illness   Maureen Mahoney is a 57 y.o. female presenting today to schedule three year surveillance colonoscopy. Last colonoscopy in 10/2019. She has history of poor bowel prep in the past (12/2018) which required short interval follow up colonoscopy.  Patient is doing well with regards to constipation.  Takes Colace twice per day. BM 1-2 per day. No melena, brbpr.  The past few months she has noticed increased bloating and gas.  Started some GasX but does not seem to help. No dietary changes, no medications changes, no antibiotics.  She did start Metamucil a few months back, initially 3 capsules daily but try cutting back to 2 capsules daily which she noted increased bloating and gas.  She is not sure if it is contributing to her symptoms however.  Some intermittent heartburn/indigestion related to certain foods.  No dysphagia, abdominal pain, unintentional weight loss, nausea/vomiting.    Colonoscopy 10/2019: -one 4mm polyp in rectum -one 12 mm polyp in proximal rectum -external and internal hemorrhoids -serrated adenoma and hyperplastic polyps   Medications   Current Outpatient Medications  Medication Sig Dispense Refill   ascorbic acid (VITAMIN C) 100 MG tablet Take by mouth.     aspirin 81 MG chewable tablet Chew 81 mg by mouth every evening.      atorvastatin (LIPITOR) 10 MG tablet Take 10 mg by mouth daily.     docusate sodium (COLACE) 100 MG capsule Take 1 capsule (100 mg total) by mouth 2 (two) times daily. 10 capsule    escitalopram (LEXAPRO) 10 MG tablet Take 10 mg by mouth daily.     fexofenadine (ALLEGRA ALLERGY) 60 MG tablet Take 1  tablet (60 mg total) by mouth 2 (two) times daily.     metFORMIN (GLUCOPHAGE) 500 MG tablet Take 500 mg by mouth 2 (two) times daily with a meal.      Multiple Vitamins-Minerals (MULTIVITAMIN WITH MINERALS) tablet Take 1 tablet by mouth every evening.      Probiotic Product (PROBIOTIC DAILY PO) Take 2 capsules by mouth daily.     psyllium (REGULOID) 0.52 g capsule Take 1.04 g by mouth daily.     Simethicone (GAS-X PO) Take 2 capsules by mouth daily as needed.     SUMAtriptan (IMITREX) 100 MG tablet TAKE 1 TABLET BY MOUTH AT ONSET OF MIGRAINE. MAY REPEAT ONCE AFTER 2 HOURS IF NEEDED     tamoxifen (NOLVADEX) 20 MG tablet Take 1 tablet (20 mg total) by mouth every evening. 90 tablet 3   vitamin B-12 (CYANOCOBALAMIN) 1000 MCG tablet Take 1 tablet (1,000 mcg total) by mouth daily.     No current facility-administered medications for this visit.    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   Family history  of breast cancer    Family history of lung cancer    Family history of melanoma    Irritable bowel disease     Past Surgical History   Past Surgical History:  Procedure Laterality Date   BREAST RECONSTRUCTION WITH PLACEMENT OF TISSUE EXPANDER AND ALLODERM Bilateral 05/14/2018   Procedure: BILATERAL BREAST RECONSTRUCTION WITH PLACEMENT OF TISSUE EXPANDER AND ACELLULAR DERMIS;  Surgeon: Glenna Fellows, MD;  Location: [facility];  Service: Plastics;  Laterality: Bilateral;   COLONOSCOPY N/A 12/09/2018   Procedure: COLONOSCOPY;  Surgeon: West Bali, MD;  Location: [facility];  Service: Endoscopy;  Laterality: N/A;  1:00pm    COLONOSCOPY N/A 10/20/2019   Procedure: COLONOSCOPY;  Surgeon: West Bali, MD;  Location: [facility];  Service: Endoscopy;  Laterality: N/A;  10:30   LIPOSUCTION WITH LIPOFILLING Bilateral 08/30/2018   Procedure: LIPOFILLING FROM ABDOMEN TO BILATERAL CHEST;  Surgeon: Glenna Fellows, MD;  Location: [facility];  Service: Plastics;  Laterality: Bilateral;   MASTECTOMY W/ SENTINEL NODE BIOPSY Bilateral 05/14/2018   Procedure: BILATERAL TOTAL MASTECTOMIES WITH BILATERAL SENTINEL LYMPH NODE BIOPSIES;  Surgeon: Emelia Loron, MD;  Location: [facility];  Service: General;  Laterality: Bilateral;   POLYPECTOMY  12/09/2018   Procedure: POLYPECTOMY;  Surgeon: West Bali, MD;  Location: [facility];  Service: Endoscopy;;   POLYPECTOMY  10/20/2019   Procedure: POLYPECTOMY;  Surgeon: West Bali, MD;  Location: [facility];  Service: Endoscopy;;   REMOVAL OF BILATERAL TISSUE EXPANDERS WITH PLACEMENT OF BILATERAL BREAST IMPLANTS Bilateral 08/30/2018   Procedure: REMOVAL OF BILATERAL TISSUE EXPANDERS WITH PLACEMENT OF BILATERAL BREAST IMPLANTS;  Surgeon: Glenna Fellows, MD;  Location: [facility];  Service: Plastics;  Laterality: Bilateral;   TISSUE EXPANDER PLACEMENT Left 06/14/2018   Procedure: removal and replacement left chest tissue expander;  Surgeon: Glenna Fellows, MD;  Location:  S[facility];  Service: Plastics;  Laterality: Left;    Past Family History   Family History  Problem Relation Age of Onset   Lung cancer Mother 69   Other Maternal Grandmother        pituitary tumor, not cancer   Parkinson's disease Maternal Grandfather    Heart disease Maternal Uncle    Cancer Maternal Uncle        sarcoma   Heart disease Paternal Aunt    Breast cancer Other 70   Breast cancer Other 50   Breast cancer Other 11   Lymphoma Other    Lung cancer Cousin    Colon cancer Neg Hx    Gastric cancer Neg Hx    Esophageal  cancer Neg Hx     Past Social History   Social History   Socioeconomic History   Marital status: Divorced    Spouse name: Not on file   Number of children: Not on file   Years of education: Not on file   Highest education level: Not on file  Occupational History   Not on file  Tobacco Use   Smoking status: Never   Smokeless tobacco: Never  Vaping Use   Vaping Use: Never used  Substance and Sexual Activity   Alcohol use: Yes    Comment: occass   Drug use: Never   Sexual activity: Not Currently  Other Topics Concern   Not on file  Social History Narrative   Not on file   Social Determinants of Health   Financial Resource Strain: Not on file  Food Insecurity: Not on file  Transportation Needs: Not on file  Physical Activity: Not on file  Stress: Not on file  Social Connections: Not on file  Intimate Partner Violence: Not on file    Review of Systems   General: Negative for anorexia, weight loss, fever, chills, fatigue, weakness. Eyes: Negative for vision changes.  ENT: Negative for hoarseness, difficulty swallowing , nasal congestion. CV: Negative for chest pain, angina, palpitations, dyspnea on exertion, peripheral edema.  Respiratory: Negative for dyspnea at rest, dyspnea on exertion, cough, sputum, wheezing.  GI: See history of present illness. GU:  Negative for dysuria, hematuria, urinary incontinence, urinary frequency, nocturnal urination.  MS: Negative for joint pain, low back pain.  Derm: Negative for rash or itching.  Neuro: Negative for weakness, abnormal sensation, seizure, frequent headaches, memory loss,  confusion.  Psych: Negative for anxiety, depression, suicidal ideation, hallucinations.  Endo: Negative for unusual weight change.  Heme: Negative for bruising or bleeding. Allergy: Negative for rash or hives.  Physical Exam   BP 125/79 (BP Location: Right Arm, Patient Position: Sitting, Cuff Size: Large)   Pulse 93   Temp 98.1 F (36.7 C)  (Oral)   Ht 5\' 5"  (1.651 m)   Wt 198 lb (89.8 kg)   SpO2 97%   BMI 32.95 kg/m    General: Well-nourished, well-developed in no acute distress.  Head: Normocephalic, atraumatic.   Eyes: Conjunctiva pink, no icterus. Mouth: Oropharyngeal mucosa moist and pink   Neck: Supple without thyromegaly, masses, or lymphadenopathy.  Lungs: Clear to auscultation bilaterally.  Heart: Regular rate and rhythm, no murmurs rubs or gallops.  Abdomen: Bowel sounds are normal, nontender, nondistended, no hepatosplenomegaly or masses,  no abdominal bruits or hernia, no rebound or guarding.   Rectal: not performed Extremities: No lower extremity edema. No clubbing or deformities.  Neuro: Alert and oriented x 4 , grossly normal neurologically.  Skin: Warm and dry, no rash or jaundice.   Psych: Alert and cooperative, normal mood and affect.  Labs   None available  Imaging Studies   No results found.  Assessment   History of colon polyps: Due for surveillance colonoscopy.  Difficult bowel prep in early 2020, patient states she was advised it was related to eating [place] broths.  Repeat colonoscopy with avoidance of this particular broth resulted in good bowel prep with same prep (Suprep).  Augment bowel prep with bisacodyl several days prior to procedure.  Bloating/flatulence: Possibly related to Metamucil.  Will switch her fiber.  Limit high FODMAP foods.  Complete colonoscopy.  If ongoing symptoms, consider trial of antibiotic for SIBO versus imaging.   PLAN   Colonoscopy in the near future.  ASA 2.  I have discussed the risks, alternatives, benefits with regards to but not limited to the risk of reaction to medication, bleeding, infection, perforation and the patient is agreeable to proceed. Written consent to be obtained. Switch Metamucil to Benefiber or Fiber Choice. Back on high FODMAP foods. Reassess bloating/flatulence after colonoscopy changes above.  May require antibiotic for SIBO versus  imaging.   Leanna Battles. Melvyn Neth, MHS, PA-C [facility]

## 2023-04-20 NOTE — Patient Instructions (Addendum)
Colonoscopy to be scheduled.  Try switching Metamucil to Benefiber or Fiberchoice. This may cut down on your bloating and gas. Try to get 3-4 grams of supplemental fiber per day. Cut back on any foods in the list below as they may increase gas and bloating.   Foods to avoid/reduce to decrease bloating/gas Fruits Fresh, dried, and juiced forms of apple, pear, watermelon, peach, plum, cherries, apricots, blackberries, boysenberries, figs, nectarines, and mango. Avocado. Vegetables Chicory root, artichoke, asparagus, cabbage, snow peas, Brussels sprouts, broccoli, sugar snap peas, mushrooms, celery, and cauliflower. Onions, garlic, leeks, and the white part of scallions. Grains Wheat, including kamut, durum, and semolina. Barley and bulgur. Couscous. Wheat-based cereals. Wheat noodles, bread, crackers, and pastries. Meats and other proteins Fried or fatty meat. Sausage. Cashews and pistachios. Soybeans, baked beans, black beans, chickpeas, kidney beans, fava beans, navy beans, lentils, black-eyed peas, and split peas. Dairy Milk, yogurt, ice cream, and soft cheese. Cream and sour cream. Milk-based sauces. Custard. Buttermilk. Soy milk. Seasoning and other foods Any sugar-free gum or candy. Foods that contain artificial sweeteners such as sorbitol, mannitol, isomalt, or xylitol. Foods that contain honey, high-fructose corn syrup, or agave. Bouillon, vegetable stock, beef stock, and chicken stock. Garlic and onion powder. Condiments made with onion, such as hummus, chutney, pickles, relish, salad dressing, and salsa. Tomato paste. Beverages Chicory-based drinks. Coffee substitutes. Chamomile tea. Fennel tea. Sweet or fortified wines such as port or sherry. Diet soft drinks made with isomalt, mannitol, maltitol, sorbitol, or xylitol. Apple, pear, and mango juice. Juices with high-fructose corn syrup. The items listed above may not be a complete list of foods and beverages you should avoid. Contact a  dietitian for more information.

## 2023-04-23 ENCOUNTER — Telehealth: Payer: Self-pay | Admitting: *Deleted

## 2023-04-23 MED ORDER — NA SULFATE-K SULFATE-MG SULF 17.5-3.13-1.6 GM/177ML PO SOLN: ORAL | 0 refills | Status: DC

## 2023-04-23 NOTE — Telephone Encounter (Signed)
Spoke with pt. Scheduled TCS with Dr. Marletta Lor, ASA 2 in 7/2. Aware will send instructions with leslie recs. Per encounter no gallon prep. Will needs bisacodyl 3 days prior to prep.

## 2023-04-27 ENCOUNTER — Encounter (INDEPENDENT_AMBULATORY_CARE_PROVIDER_SITE_OTHER): Payer: BC Managed Care – PPO | Admitting: Ophthalmology

## 2023-05-07 ENCOUNTER — Telehealth: Payer: Self-pay | Admitting: *Deleted

## 2023-05-07 NOTE — Telephone Encounter (Signed)
Spoke with pt. Advised Dr. Marletta Lor would not be available on 7/2 and would need to reschedule procedure. She moved to 7/30. Aware will send new instructions. Endo aware.

## 2023-05-11 ENCOUNTER — Encounter (INDEPENDENT_AMBULATORY_CARE_PROVIDER_SITE_OTHER): Payer: BC Managed Care – PPO | Admitting: Ophthalmology

## 2023-05-31 ENCOUNTER — Telehealth: Payer: Self-pay | Admitting: Hematology and Oncology

## 2023-05-31 NOTE — Telephone Encounter (Signed)
Rescheduled appointment per patient. She is aware of the changes made to her upcoming appointment.

## 2023-06-04 ENCOUNTER — Ambulatory Visit: Payer: BC Managed Care – PPO | Admitting: Hematology and Oncology

## 2023-06-09 ENCOUNTER — Other Ambulatory Visit: Payer: Self-pay | Admitting: Hematology and Oncology

## 2023-06-12 ENCOUNTER — Ambulatory Visit (HOSPITAL_COMMUNITY): Admission: RE | Admit: 2023-06-12 | Payer: BC Managed Care – PPO | Source: Home / Self Care

## 2023-06-12 ENCOUNTER — Encounter (HOSPITAL_COMMUNITY): Payer: Self-pay

## 2023-06-12 ENCOUNTER — Encounter (HOSPITAL_COMMUNITY)
Admission: RE | Disposition: A | Discharge status: Home / Self Care | Payer: Self-pay | Source: Home / Self Care | Attending: Internal Medicine

## 2023-06-12 ENCOUNTER — Ambulatory Visit (HOSPITAL_COMMUNITY): Payer: BC Managed Care – PPO | Admitting: Anesthesiology

## 2023-06-12 ENCOUNTER — Other Ambulatory Visit: Payer: Self-pay

## 2023-06-12 DIAGNOSIS — Z1211 Encounter for screening for malignant neoplasm of colon: Secondary | ICD-10-CM

## 2023-06-12 DIAGNOSIS — Z86010 Personal history of colonic polyps: Secondary | ICD-10-CM | POA: Diagnosis not present

## 2023-06-12 DIAGNOSIS — D120 Benign neoplasm of cecum: Secondary | ICD-10-CM | POA: Diagnosis not present

## 2023-06-12 DIAGNOSIS — K635 Polyp of colon: Secondary | ICD-10-CM | POA: Insufficient documentation

## 2023-06-12 DIAGNOSIS — E119 Type 2 diabetes mellitus without complications: Secondary | ICD-10-CM | POA: Insufficient documentation

## 2023-06-12 DIAGNOSIS — K589 Irritable bowel syndrome without diarrhea: Secondary | ICD-10-CM | POA: Insufficient documentation

## 2023-06-12 DIAGNOSIS — Z7984 Long term (current) use of oral hypoglycemic drugs: Secondary | ICD-10-CM | POA: Insufficient documentation

## 2023-06-12 DIAGNOSIS — Q438 Other specified congenital malformations of intestine: Secondary | ICD-10-CM | POA: Insufficient documentation

## 2023-06-12 DIAGNOSIS — K649 Unspecified hemorrhoids: Secondary | ICD-10-CM | POA: Diagnosis not present

## 2023-06-12 DIAGNOSIS — Z09 Encounter for follow-up examination after completed treatment for conditions other than malignant neoplasm: Secondary | ICD-10-CM | POA: Diagnosis present

## 2023-06-12 DIAGNOSIS — Z853 Personal history of malignant neoplasm of breast: Secondary | ICD-10-CM | POA: Diagnosis not present

## 2023-06-12 HISTORY — PX: POLYPECTOMY: SHX5525

## 2023-06-12 HISTORY — PX: COLONOSCOPY WITH PROPOFOL: SHX5780

## 2023-06-12 LAB — GLUCOSE, CAPILLARY: Glucose-Capillary: 145 mg/dL — ABNORMAL HIGH (ref 70–99)

## 2023-06-12 SURGERY — COLONOSCOPY WITH PROPOFOL
Anesthesia: General

## 2023-06-12 MED ORDER — STERILE WATER FOR IRRIGATION IR SOLN
Status: DC | PRN
  Administered 2023-06-12: 200 mL

## 2023-06-12 MED ORDER — PROPOFOL 10 MG/ML IV BOLUS
INTRAVENOUS | Status: DC | PRN
  Administered 2023-06-12: 100 mg via INTRAVENOUS

## 2023-06-12 MED ORDER — PROPOFOL 500 MG/50ML IV EMUL
INTRAVENOUS | Status: DC | PRN
  Administered 2023-06-12: 150 ug/kg/min via INTRAVENOUS

## 2023-06-12 MED ORDER — LACTATED RINGERS IV SOLN: INTRAVENOUS | Status: DC

## 2023-06-12 NOTE — Op Note (Signed)
[facility] Patient Name: Maureen Mahoney Procedure Date: 06/12/2023 9:33 AM MRN: [id number] Date of Birth: 04/09/1966 Attending MD: Hennie Duos. Marletta Lor , [place], [id number] CSN: [id number] Age: 57 Admit Type: Outpatient Procedure:                Colonoscopy Indications:              Surveillance: Personal history of adenomatous                            polyps on last colonoscopy > 3 years ago Providers:                Hennie Duos. Marletta Lor, DO, Buel Ream. Thomasena Edis RN, RN,                            Lennice Sites Technician, Technician, Elinor Parkinson Referring MD:              Medicines:                See the Anesthesia note for documentation of the                            administered medications Complications:            No immediate complications. Estimated Blood Loss:     Estimated blood loss was minimal. Procedure:                Pre-Anesthesia Assessment:                           - The anesthesia plan was to use monitored                            anesthesia care (MAC).                           After obtaining informed consent, the colonoscope                            was passed under direct vision. Throughout the                            procedure, the patient's blood pressure, pulse, and                            oxygen saturations were monitored continuously. The                            [id number]-HQ190L ([id number]) scope was introduced through                            the anus and advanced to the the cecum, identified                            by appendiceal orifice and ileocecal valve. The  colonoscopy was technically difficult and complex                            due to a redundant colon, significant looping and a                            tortuous colon. Successful completion of the                            procedure was aided by applying abdominal pressure.                            The patient tolerated the procedure well. The                             quality of the bowel preparation was evaluated                            using the BBPS [facility] Bowel Preparation Scale)                            with scores of: Right Colon = 2 (minor amount of                            residual staining, small fragments of stool and/or                            opaque liquid, but mucosa seen well), Transverse                            Colon = 2 (minor amount of residual staining, small                            fragments of stool and/or opaque liquid, but mucosa                            seen well) and Left Colon = 2 (minor amount of                            residual staining, small fragments of stool and/or                            opaque liquid, but mucosa seen well). The total                            BBPS score equals 6. Fair. Scope In: 9:46:49 AM Scope Out: 10:15:41 AM Scope Withdrawal Time: 0 hours 20 minutes 27 seconds  Total Procedure Duration: 0 hours 28 minutes 52 seconds  Findings:      Hemorrhoids were found on perianal exam.      A 15 mm polyp was found in the cecum. The polyp was sessile. The polyp       was removed in piecemal fashion with a cold  snare. Resection and       retrieval were complete.      A 5 mm polyp was found in the transverse colon. The polyp was sessile.       The polyp was removed with a cold snare. Resection and retrieval were       complete.      The colon (entire examined portion) was significantly redundant.       Advancing the scope required applying abdominal pressure.      A moderate amount of stool was found in the entire colon, making       visualization difficult. Lavage of the area was performed using copious       amounts of sterile water, resulting in clearance with fair visualization. Impression:               - Hemorrhoids found on perianal exam.                           - One 15 mm polyp in the cecum, removed with a cold                            snare. Resected  and retrieved.                           - One 5 mm polyp in the transverse colon, removed                            with a cold snare. Resected and retrieved.                           - Redundant colon.                           - Stool in the entire examined colon. Moderate Sedation:      Per Anesthesia Care Recommendation:           - Patient has a contact number available for                            emergencies. The signs and symptoms of potential                            delayed complications were discussed with the                            patient. Return to normal activities tomorrow.                            Written discharge instructions were provided to the                            patient.                           - Resume previous diet.                           -  Continue present medications.                           - Await pathology results.                           - Repeat colonoscopy in 3 years for surveillance.                           - Return to GI clinic in 3 months. Procedure Code(s):        --- Professional ---                           [phone], Colonoscopy, flexible; with removal of                            tumor(s), polyp(s), or other lesion(s) by snare                            technique Diagnosis Code(s):        --- Professional ---                           Z86.010, Personal history of colonic polyps                           D12.0, Benign neoplasm of cecum                           D12.3, Benign neoplasm of transverse colon (hepatic                            flexure or splenic flexure)                           K64.9, Unspecified hemorrhoids                           Q43.8, Other specified congenital malformations of                            intestine CPT copyright 2022 American Medical Association. All rights reserved. The codes documented in this report are preliminary and upon coder review may  be revised to meet current compliance  requirements. Hennie Duos. Marletta Lor, DO Hennie Duos. Saquan Furtick, DO 06/12/2023 10:19:58 AM This report has been signed electronically. Number of Addenda: 0

## 2023-06-12 NOTE — Discharge Instructions (Addendum)
  Colonoscopy Discharge Instructions  Read the instructions outlined below and refer to this sheet in the next few weeks. These discharge instructions provide you with general information on caring for yourself after you leave the hospital. Your doctor may also give you specific instructions. While your treatment has been planned according to the most current medical practices available, unavoidable complications occasionally occur.   ACTIVITY You may resume your regular activity, but move at a slower pace for the next 24 hours.  Take frequent rest periods for the next 24 hours.  Walking will help get rid of the air and reduce the bloated feeling in your belly (abdomen).  No driving for 24 hours (because of the medicine (anesthesia) used during the test).   Do not sign any important legal documents or operate any machinery for 24 hours (because of the anesthesia used during the test).  NUTRITION Drink plenty of fluids.  You may resume your normal diet as instructed by your doctor.  Begin with a light meal and progress to your normal diet. Heavy or fried foods are harder to digest and may make you feel sick to your stomach (nauseated).  Avoid alcoholic beverages for 24 hours or as instructed.  MEDICATIONS You may resume your normal medications unless your doctor tells you otherwise.  WHAT YOU CAN EXPECT TODAY Some feelings of bloating in the abdomen.  Passage of more gas than usual.  Spotting of blood in your stool or on the toilet paper.  IF YOU HAD POLYPS REMOVED DURING THE COLONOSCOPY: No aspirin products for 7 days or as instructed.  No alcohol for 7 days or as instructed.  Eat a soft diet for the next 24 hours.  FINDING OUT THE RESULTS OF YOUR TEST Not all test results are available during your visit. If your test results are not back during the visit, make an appointment with your caregiver to find out the results. Do not assume everything is normal if you have not heard from your  caregiver or the medical facility. It is important for you to follow up on all of your test results.  SEEK IMMEDIATE MEDICAL ATTENTION IF: You have more than a spotting of blood in your stool.  Your belly is swollen (abdominal distention).  You are nauseated or vomiting.  You have a temperature over 101.  You have abdominal pain or discomfort that is severe or gets worse throughout the day.   Your colonoscopy revealed 2 polyp(s) which I removed successfully. One was on the larger size. Await pathology results, my office will contact you. I recommend repeating colonoscopy in 3 years for surveillance purposes. Follow up with GI in 3 months. Message sent to office and will contact you about appointment.    I hope you have a great rest of your week!  Hennie Duos. Marletta Lor, D.O. Gastroenterology and Hepatology [facility]

## 2023-06-12 NOTE — H&P (Signed)
Primary Care Physician:  Carylon Perches, MD Primary Gastroenterologist:  Dr. Marletta Lor  Pre-Procedure History & Physical: HPI:  Maureen Mahoney is a 57 y.o. 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   Family history of breast cancer    Family history of lung cancer    Family history of melanoma    Irritable bowel disease     Past Surgical History:  Procedure Laterality Date   BREAST RECONSTRUCTION WITH PLACEMENT OF TISSUE EXPANDER AND ALLODERM Bilateral 05/14/2018   Procedure: BILATERAL BREAST RECONSTRUCTION WITH PLACEMENT OF TISSUE EXPANDER AND ACELLULAR DERMIS;  Surgeon: Glenna Fellows, MD;  Location: [facility];  Service: Plastics;  Laterality: Bilateral;   COLONOSCOPY N/A 12/09/2018   Procedure: COLONOSCOPY;  Surgeon: West Bali, MD;  [facility];  Service: Endoscopy;  Laterality: N/A;  1:00pm   COLONOSCOPY N/A 10/20/2019   Procedure: COLONOSCOPY;  Surgeon: West Bali, MD;  [facility];  Service: Endoscopy;  Laterality: N/A;  10:30   LIPOSUCTION WITH LIPOFILLING Bilateral 08/30/2018   Procedure: LIPOFILLING FROM ABDOMEN TO BILATERAL CHEST;  Surgeon: Glenna Fellows, MD;  Location: [facility];  Service: Plastics;  Laterality: Bilateral;   MASTECTOMY W/ SENTINEL NODE BIOPSY Bilateral 05/14/2018   Procedure: BILATERAL TOTAL MASTECTOMIES WITH BILATERAL SENTINEL LYMPH NODE BIOPSIES;  Surgeon: Emelia Loron, MD;  Location: [facility];  Service: General;  Laterality: Bilateral;   POLYPECTOMY  12/09/2018   Procedure: POLYPECTOMY;  Surgeon: West Bali, MD;  [facility];  Service: Endoscopy;;    POLYPECTOMY  10/20/2019   Procedure: POLYPECTOMY;  Surgeon: West Bali, MD;  [facility];  Service: Endoscopy;;   REMOVAL OF BILATERAL TISSUE EXPANDERS WITH PLACEMENT OF BILATERAL BREAST IMPLANTS Bilateral 08/30/2018   Procedure: REMOVAL OF BILATERAL TISSUE EXPANDERS WITH PLACEMENT OF BILATERAL BREAST IMPLANTS;  Surgeon: Glenna Fellows, MD;  Location: [facility];  Service: Plastics;  Laterality: Bilateral;   TISSUE EXPANDER PLACEMENT Left 06/14/2018   Procedure: removal and replacement left chest tissue expander;  Surgeon: Glenna Fellows, MD;  Location: [facility];  Service: Plastics;  Laterality: Left;    Prior to Admission medications   Medication Sig Start Date End Date Taking? Authorizing Provider  acetaminophen (TYLENOL) 500 MG tablet Take 500-1,000 mg by mouth every 6 (six) hours as needed (pain.).   Yes [provider]  ALPRAZolam (XANAX) 0.25 MG tablet Take 0.25 mg by mouth daily as needed for anxiety. 05/25/23  Yes [provider]  Ascorbic Acid (VITAMIN C) 1000 MG tablet Take 1,000 mg by mouth in the morning.   Yes [provider]  aspirin EC 81 MG tablet Take 81 mg by mouth every evening.   Yes [provider]  aspirin-acetaminophen-caffeine (EXCEDRIN MIGRAINE) [phone] MG tablet Take 1-2 tablets by mouth 2 (two) times daily as needed for headache.   Yes [provider]  atorvastatin (LIPITOR) 10 MG tablet Take 10 mg by mouth at bedtime.   Yes [provider]  docusate sodium (COLACE) 100 MG capsule Take 1 capsule (100 mg total) by mouth 2 (two) times daily. 06/01/22  Yes Serena Croissant, MD  escitalopram (LEXAPRO) 20 MG tablet Take 20 mg by mouth in the morning.   Yes [provider]  fexofenadine (ALLEGRA) 180 MG tablet Take 180 mg by mouth in the morning.   Yes [provider]  liver oil-zinc oxide (DESITIN) 40 % ointment Apply 1 Application topically as needed for  irritation.   Yes [provider]  metFORMIN (GLUCOPHAGE) 500 MG tablet Take 500 mg by mouth in the morning and at bedtime.   Yes [provider]  Multiple Vitamins-Minerals (MULTIVITAMIN WITH MINERALS) tablet Take 1 tablet by mouth every evening. Multivitamin for Adults 50+   Yes [provider]  Polyethyl Glyc-Propyl Glyc PF (SYSTANE ULTRA PF) 0.4-0.3 % SOLN Place 1-2 drops into both eyes 3 (three) times daily as needed (dry/irritated eyes.).   Yes [provider]  Probiotic Product (PROBIOTIC DAILY PO) Take 2 capsules by mouth in the morning.   Yes [provider]  PROCTOFOAM [facility] rectal foam Place 1 applicator rectally 2 (two) times daily as needed (discomfort/irritation.). 05/24/23  Yes [provider]  shark liver oil-cocoa butter (PREPARATION H) 0.25-3-85.5 % suppository Place 1 suppository rectally as needed for hemorrhoids.   Yes [provider]  Simethicone (GAS-X PO) Take 2 capsules by mouth at bedtime.   Yes [provider]  tamoxifen (NOLVADEX) 20 MG tablet TAKE 1 TABLET BY MOUTH EVERY EVENING 06/11/23  Yes Serena Croissant, MD  vitamin B-12 (CYANOCOBALAMIN) 1000 MCG tablet Take 1 tablet (1,000 mcg total) by mouth daily. 06/01/22  Yes Serena Croissant, MD  Na Sulfate-K Sulfate-Mg Sulf 17.5-3.13-1.6 GM/177ML SOLN As directed 04/23/23   Lanelle Bal, DO  SUMAtriptan (IMITREX) 100 MG tablet Take 100 mg by mouth every 2 (two) hours as needed for migraine. 01/01/23   [provider]    Allergies as of 04/23/2023 - Review Complete 04/20/2023  Allergen Reaction Noted   Penicillins Other (See Comments) 06/26/2016    Family History  Problem Relation Age of Onset   Lung cancer Mother 66   Other Maternal Grandmother        pituitary tumor, not cancer   Parkinson's disease Maternal Grandfather    Heart disease Maternal Uncle    Cancer Maternal Uncle        sarcoma   Heart disease Paternal Aunt    Breast cancer  Other 1   Breast cancer Other 50   Breast cancer Other 80   Lymphoma Other    Lung cancer Cousin    Colon cancer Neg Hx    Gastric cancer Neg Hx    Esophageal cancer Neg Hx     Social History   Socioeconomic History   Marital status: Divorced    Spouse name: Not on file   Number of children: Not on file   Years of education: Not on file   Highest education level: Not on file  Occupational History   Not on file  Tobacco Use   Smoking status: Never   Smokeless tobacco: Never  Vaping Use   Vaping status: Never Used  Substance and Sexual Activity   Alcohol use: Yes    Alcohol/week: 2.0 standard drinks of alcohol    Types: 2 Glasses of wine per week    Comment: occass   Drug use: Never   Sexual activity: Not Currently  Other Topics Concern   Not on file  Social History Narrative   Not on file   Social Determinants of Health   Financial Resource Strain: Not on file  Food Insecurity: No Food Insecurity (10/23/2019)   [facility], [facility]   Hunger Vital Sign    Worried About Running Out of Food in the Last Year: Never true    Ran Out of Food in the Last Year: Never true  Transportation Needs: No Transportation Needs (10/23/2019)   Received from [facility], [facility]   [facility] - Transportation    Lack of Transportation (Medical): No    Lack of Transportation (Non-Medical): No  Physical Activity: Not on file  Stress: No Stress Concern Present (10/23/2019)   Received from [facility], [facility] - Occupational Stress Questionnaire    Feeling of Stress : Not at all  Social Connections: Not on file  Intimate Partner Violence: Not At Risk (10/23/2019)   Received from [facility], [facility]   Humiliation, Afraid, Rape, and Kick questionnaire    Fear of Current or Ex-Partner: No    Emotionally Abused: No    Physically Abused: No    Sexually Abused: No    Review of  Systems: See HPI, otherwise negative ROS  Physical Exam: Vital signs in last 24 hours: Temp:  [98.5 F (36.9 C)] 98.5 F (36.9 C) (07/30 0844) Pulse Rate:  [68] 68 (07/30 0844) Resp:  [23] 23 (07/30 0844) BP: (135)/(78) 135/78 (07/30 0844) SpO2:  [96 %] 96 % (07/30 0844) Weight:  [90.7 kg] 90.7 kg (07/30 0844)   General:   Alert,  Well-developed, well-nourished, pleasant and cooperative in NAD Head:  Normocephalic and atraumatic. Eyes:  Sclera clear, no icterus.   Conjunctiva pink. Ears:  Normal auditory acuity. Nose:  No deformity, discharge,  or lesions. Msk:  Symmetrical without gross deformities. Normal posture. Extremities:  Without clubbing or edema. Neurologic:  Alert and  oriented x4;  grossly normal neurologically. Skin:  Intact without significant lesions or rashes. Psych:  Alert and cooperative. Normal mood and affect.  Impression/Plan: Maureen Mahoney is here for a colonoscopy to be performed for surveillance purposes, personal history of adenomatous colon polyps in 2020  The risks of the procedure including infection, bleed, or perforation as well as benefits, limitations, alternatives and imponderables have been reviewed with the patient. Questions have been answered. All parties agreeable.

## 2023-06-12 NOTE — Anesthesia Preprocedure Evaluation (Signed)
Anesthesia Evaluation  Patient identified by MRN, date of birth, ID band Patient awake    Reviewed: Allergy & Precautions, H&P , NPO status , Patient's Chart, lab work & pertinent test results, reviewed documented beta blocker date and time   Airway Mallampati: II  TM Distance: >3 FB Neck ROM: full    Dental no notable dental hx.    Pulmonary neg pulmonary ROS   Pulmonary exam normal breath sounds clear to auscultation       Cardiovascular Exercise Tolerance: Good negative cardio ROS  Rhythm:regular Rate:Normal     Neuro/Psych   Anxiety     negative neurological ROS  negative psych ROS   GI/Hepatic negative GI ROS, Neg liver ROS,,,  Endo/Other  negative endocrine ROSdiabetes    Renal/GU negative Renal ROS  negative genitourinary   Musculoskeletal   Abdominal   Peds  Hematology negative hematology ROS (+)   Anesthesia Other Findings   Reproductive/Obstetrics negative OB ROS                             Anesthesia Physical Anesthesia Plan  ASA: 2  Anesthesia Plan: General   Post-op Pain Management:    Induction:   PONV Risk Score and Plan: Propofol infusion  Airway Management Planned:   Additional Equipment:   Intra-op Plan:   Post-operative Plan:   Informed Consent: I have reviewed the patients History and Physical, chart, labs and discussed the procedure including the risks, benefits and alternatives for the proposed anesthesia with the patient or authorized representative who has indicated his/her understanding and acceptance.     Dental Advisory Given  Plan Discussed with: CRNA  Anesthesia Plan Comments:        Anesthesia Quick Evaluation

## 2023-06-12 NOTE — Transfer of Care (Signed)
Immediate Anesthesia Transfer of Care Note  Patient: Maureen Mahoney  Procedure(s) Performed: COLONOSCOPY WITH PROPOFOL POLYPECTOMY  Patient Location: Endoscopy Unit  Anesthesia Type:General  Level of Consciousness: awake and alert   Airway & Oxygen Therapy: Patient Spontanous Breathing  Post-op Assessment: Report given to RN and Post -op Vital signs reviewed and stable  Post vital signs: Reviewed and stable  Last Vitals:  Vitals Value Taken Time  BP 105/55 06/12/23 1019  Temp 36.4 C 06/12/23 1019  Pulse 81 06/12/23 1019  Resp 16 06/12/23 1019  SpO2 97 % 06/12/23 1019    Last Pain:  Vitals:   06/12/23 1019  TempSrc: Axillary  PainSc: 0-No pain      Patients Stated Pain Goal: 5 (06/12/23 0844)  Complications: No notable events documented.

## 2023-06-15 NOTE — Anesthesia Postprocedure Evaluation (Signed)
Anesthesia Post Note  Patient: Maureen Mahoney  Procedure(s) Performed: COLONOSCOPY WITH PROPOFOL POLYPECTOMY  Patient location during evaluation: Phase II Anesthesia Type: General Level of consciousness: awake Pain management: pain level controlled Vital Signs Assessment: post-procedure vital signs reviewed and stable Respiratory status: spontaneous breathing and respiratory function stable Cardiovascular status: blood pressure returned to baseline and stable Postop Assessment: no headache and no apparent nausea or vomiting Anesthetic complications: no Comments: Late entry   No notable events documented.   Last Vitals:  Vitals:   06/12/23 0844 06/12/23 1019  BP: 135/78 (!) 105/55  Pulse: 68 81  Resp: (!) 23 16  Temp: 36.9 C 36.4 C  SpO2: 96% 97%    Last Pain:  Vitals:   06/12/23 1019  TempSrc: Axillary  PainSc: 0-No pain                 Windell Norfolk

## 2023-06-18 ENCOUNTER — Encounter (HOSPITAL_COMMUNITY): Payer: Self-pay | Admitting: Internal Medicine

## 2023-06-26 ENCOUNTER — Inpatient Hospital Stay: Payer: BC Managed Care – PPO | Attending: Hematology and Oncology | Admitting: Hematology and Oncology

## 2023-06-26 ENCOUNTER — Other Ambulatory Visit: Payer: Self-pay

## 2023-06-26 VITALS — BP 137/73 | HR 78 | Temp 97.7°F | Resp 17 | Wt 199.7 lb

## 2023-06-26 DIAGNOSIS — C50412 Malignant neoplasm of upper-outer quadrant of left female breast: Secondary | ICD-10-CM | POA: Diagnosis not present

## 2023-06-26 DIAGNOSIS — Z170 Estrogen receptor positive status [ER+]: Secondary | ICD-10-CM | POA: Diagnosis not present

## 2023-06-26 DIAGNOSIS — Z79810 Long term (current) use of selective estrogen receptor modulators (SERMs): Secondary | ICD-10-CM | POA: Insufficient documentation

## 2023-06-26 DIAGNOSIS — Z9013 Acquired absence of bilateral breasts and nipples: Secondary | ICD-10-CM | POA: Insufficient documentation

## 2023-06-26 NOTE — Assessment & Plan Note (Signed)
05/14/2018: Bilateral mastectomies: Left mastectomy: IDC with papillary features grade 2, 1.7 cm, intermediate grade DCIS, LCIS, margins negative, 0/2 lymph nodes negative, ER 100%, PR 100%, Ki-67 10%, HER-2 negative ratio 1.21, T1CN0 stage Ia; Right mastectomy: DCIS intermediate grade 1.2 cm, margins negative, 0/3 lymph nodes negative, ER 100%, PR 80%, Tis NX stage 0 Oncotype DX testing on the left breast: Recurrence score 0: 3% risk of distant recurrence of 9 years   Current treatment: Tamoxifen 20 mg daily x5 years started 06/11/2018 switched to letrozole in July 2023 (discontinued letrozole because of maculopapular rash) restarted tamoxifen 06/29/2022 to complete 5 years by August 2024   Breast cancer index: No benefit from extended adjuvant therapy   Tamoxifen toxicities:     Return to clinic in 1 year for follow-up with long-term survivorship

## 2023-06-26 NOTE — Progress Notes (Signed)
Patient Care Team: Carylon Perches, MD as PCP - General (Internal Medicine) Serena Croissant, MD as Consulting Physician (Hematology and Oncology) Emelia Loron, MD as Consulting Physician (General Surgery) Axel Filler Larna Daughters, NP as Nurse Practitioner (Hematology and Oncology)  DIAGNOSIS:  Encounter Diagnosis  Name Primary?   Malignant neoplasm of upper-outer quadrant of left breast in female, estrogen receptor positive (HCC) Yes    SUMMARY OF ONCOLOGIC HISTORY: Oncology History  Malignant neoplasm of upper-outer quadrant of left breast in female, estrogen receptor positive (HCC)  04/01/2018 Genetic Testing   Genetic testing was negative.  Genes tested include: APC, ATM, AXIN2, BAP1, BARD1, BMPR1A, BRCA1, BRCA2, BRIP1, CDH1, CDK4, CDKN2A (p14ARF), CDKN2A (p16INK4a), CHEK2, CTNNA1, DICER1, EPCAM*, GREM1*, KIT, MEN1, MLH1, MSH2, MSH3, MSH6, MUTYH, NBN, NF1, PALB2, PDGFRA, PMS2, POLD1, POLE, POT1, PTEN, RAD50, RAD51C, RAD51D, RB1, SDHB, SDHC, SDHD, SMAD4, SMARCA4, STK11, TP53, TSC1, TSC2, VHL. The following genes were evaluated for sequence changes only: HOXB13*, MITF*, NTHL1*, SDHA.   05/14/2018 Surgery   Bilateral mastectomies: Left mastectomy: IDC with papillary features grade 2, 1.7 cm, intermediate grade DCIS, LCIS, margins negative, 0/2 lymph nodes negative, ER 100%, PR 100%, Ki-67 10%, HER-2 negative ratio 1.21, T1CN0 stage Ia; Right mastectomy: DCIS intermediate grade 1.2 cm, margins negative, 3 sentinel lymph nodes negative, ER 100%, PR 80%, Tis NX stage 0   05/31/2018 Oncotype testing   Oncotype recurrence score: 0, 3% risk of distant recurrence of 9 years   07/2018 -  Anti-estrogen oral therapy   Tamoxifen daily      CHIEF COMPLIANT: Follow-up Tamoxifen  INTERVAL HISTORY: Maureen Mahoney is a 57 y.o is here for breast cancer surveillance. Currently on tamoxifen. She presents to the clinic for a follow-up. Patient has no concerns to report to the clinic. Has no lumps  or bumps in breast. She has been busy this summer because she was caring for uncle that recently passed and she was also taking care of one of her friends that is a widow.  She has slight discomfort in the left reconstructed breasts intermittently.  ALLERGIES:  is allergic to amoxil [amoxicillin], femara [letrozole], and penicillins.  MEDICATIONS:  Current Outpatient Medications  Medication Sig Dispense Refill   acetaminophen (TYLENOL) 500 MG tablet Take 500-1,000 mg by mouth every 6 (six) hours as needed (pain.).     ALPRAZolam (XANAX) 0.25 MG tablet Take 0.25 mg by mouth daily as needed for anxiety.     Ascorbic Acid (VITAMIN C) 1000 MG tablet Take 1,000 mg by mouth in the morning.     aspirin EC 81 MG tablet Take 81 mg by mouth every evening.     aspirin-acetaminophen-caffeine (EXCEDRIN MIGRAINE) 250-250-65 MG tablet Take 1-2 tablets by mouth 2 (two) times daily as needed for headache.     atorvastatin (LIPITOR) 10 MG tablet Take 10 mg by mouth at bedtime.     docusate sodium (COLACE) 100 MG capsule Take 1 capsule (100 mg total) by mouth 2 (two) times daily. 10 capsule    escitalopram (LEXAPRO) 20 MG tablet Take 20 mg by mouth in the morning.     fexofenadine (ALLEGRA) 180 MG tablet Take 180 mg by mouth in the morning.     liver oil-zinc oxide (DESITIN) 40 % ointment Apply 1 Application topically as needed for irritation.     metFORMIN (GLUCOPHAGE) 500 MG tablet Take 500 mg by mouth in the morning and at bedtime.     Multiple Vitamins-Minerals (MULTIVITAMIN WITH MINERALS) tablet Take 1 tablet by mouth  every evening. Multivitamin for Adults 50+     Polyethyl Glyc-Propyl Glyc PF (SYSTANE ULTRA PF) 0.4-0.3 % SOLN Place 1-2 drops into both eyes 3 (three) times daily as needed (dry/irritated eyes.).     Probiotic Product (PROBIOTIC DAILY PO) Take 2 capsules by mouth in the morning.     PROCTOFOAM HC rectal foam Place 1 applicator rectally 2 (two) times daily as needed (discomfort/irritation.).      shark liver oil-cocoa butter (PREPARATION H) 0.25-3-85.5 % suppository Place 1 suppository rectally as needed for hemorrhoids.     Simethicone (GAS-X PO) Take 2 capsules by mouth at bedtime.     SUMAtriptan (IMITREX) 100 MG tablet Take 100 mg by mouth every 2 (two) hours as needed for migraine.     tamoxifen (NOLVADEX) 20 MG tablet TAKE 1 TABLET BY MOUTH EVERY EVENING 90 tablet 3   vitamin B-12 (CYANOCOBALAMIN) 1000 MCG tablet Take 1 tablet (1,000 mcg total) by mouth daily.     No current facility-administered medications for this visit.    PHYSICAL EXAMINATION: ECOG PERFORMANCE STATUS: 1 - Symptomatic but completely ambulatory  Vitals:   06/26/23 0926  BP: 137/73  Pulse: 78  Resp: 17  Temp: 97.7 F (36.5 C)  SpO2: 98%   Filed Weights   06/26/23 0926  Weight: 199 lb 11.2 oz (90.6 kg)    BREAST: No palpable masses or nodules in either right or left reconstructed breasts. No palpable axillary supraclavicular or infraclavicular adenopathy   (exam performed in the presence of a chaperone)  LABORATORY DATA:  I have reviewed the data as listed    Latest Ref Rng & Units 08/26/2018    1:37 PM 06/13/2018    3:09 PM 05/07/2018   12:13 PM  CMP  Glucose 70 - 99 mg/dL 79  86  161   BUN 6 - 20 mg/dL 11  9  7    Creatinine 0.44 - 1.00 mg/dL 0.96  0.45  4.09   Sodium 135 - 145 mmol/L 140  139  138   Potassium 3.5 - 5.1 mmol/L 4.2  4.2  3.9   Chloride 98 - 111 mmol/L 108  103  106   CO2 22 - 32 mmol/L 24  25  21    Calcium 8.9 - 10.3 mg/dL 9.1  8.8  8.4     No results found for: "WBC", "HGB", "HCT", "MCV", "PLT", "NEUTROABS"  ASSESSMENT & PLAN:  Malignant neoplasm of upper-outer quadrant of left breast in female, estrogen receptor positive (HCC) 05/14/2018: Bilateral mastectomies: Left mastectomy: IDC with papillary features grade 2, 1.7 cm, intermediate grade DCIS, LCIS, margins negative, 0/2 lymph nodes negative, ER 100%, PR 100%, Ki-67 10%, HER-2 negative ratio 1.21, T1CN0 stage  Ia; Right mastectomy: DCIS intermediate grade 1.2 cm, margins negative, 0/3 lymph nodes negative, ER 100%, PR 80%, Tis NX stage 0 Oncotype DX testing on the left breast: Recurrence score 0: 3% risk of distant recurrence of 9 years   Current treatment: Tamoxifen 20 mg daily x5 years started 06/11/2018 switched to letrozole in July 2023 (discontinued letrozole because of maculopapular rash) restarted tamoxifen 06/29/2022 to complete 5 years by August 2024   Breast cancer index: No benefit from extended adjuvant therapy   Tamoxifen toxicities: Tolerated extremely well.  She will discontinue it as of now.   Return to clinic in 1 year for follow-up with long-term survivorship    No orders of the defined types were placed in this encounter.  The patient has a good understanding of the  overall plan. she agrees with it. she will call with any problems that may develop before the next visit here. Total time spent: 30 mins including face to face time and time spent for planning, charting and co-ordination of care   Tamsen Meek, MD 06/26/23    I Janan Ridge am acting as a Neurosurgeon for [employer]  I have reviewed the above documentation for accuracy and completeness, and I agree with the above.

## 2023-08-01 ENCOUNTER — Encounter: Payer: Self-pay | Admitting: Internal Medicine

## 2023-10-24 ENCOUNTER — Ambulatory Visit (INDEPENDENT_AMBULATORY_CARE_PROVIDER_SITE_OTHER): Payer: BC Managed Care – PPO | Admitting: Internal Medicine

## 2023-10-24 ENCOUNTER — Encounter: Payer: Self-pay | Admitting: Internal Medicine

## 2023-10-24 VITALS — BP 119/76 | HR 83 | Ht 65.0 in | Wt 202.6 lb

## 2023-10-24 DIAGNOSIS — K5909 Other constipation: Secondary | ICD-10-CM

## 2023-10-24 DIAGNOSIS — K5900 Constipation, unspecified: Secondary | ICD-10-CM

## 2023-10-24 DIAGNOSIS — Z860101 Personal history of adenomatous and serrated colon polyps: Secondary | ICD-10-CM

## 2023-10-24 DIAGNOSIS — D122 Benign neoplasm of ascending colon: Secondary | ICD-10-CM

## 2023-10-24 DIAGNOSIS — R140 Abdominal distension (gaseous): Secondary | ICD-10-CM

## 2023-10-24 NOTE — Progress Notes (Signed)
Referring Provider: Carylon Perches, MD Primary Care Physician:  Carylon Perches, MD Primary GI:  Dr. Marletta Lor  Chief Complaint  Patient presents with   post procedure follow up    Post procedure follow up. Has concerns about having a lengthy colon and getting cleaned out for the procedure. Would like to discuss hemorrhoids.      HPI:   Maureen Mahoney is a 57 y.o. female who presents the clinic today for follow-up visit.  Chronic constipation: Previously on MiraLAX which did not help.  Currently taking Colace twice daily and feels like this is doing well.  Rarely has hard bowel movements or straining unless she misses doses.  She was previously recommended she start Benefiber as well but states she has not done this.  Does note some associated abdominal bloating.  Has tried Gas-X in the past.  Colonoscopy 06/12/2023 redundant tortuous colon, 15 mm polyp removed from the cecum tubular adenoma.  Recommended recall 3 years.  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   Family history of breast cancer    Family history of lung cancer    Family history of melanoma    Irritable bowel disease     Past Surgical History:  Procedure Laterality Date   BREAST RECONSTRUCTION WITH PLACEMENT OF TISSUE EXPANDER AND ALLODERM Bilateral 05/14/2018   Procedure: BILATERAL BREAST RECONSTRUCTION WITH PLACEMENT OF TISSUE EXPANDER AND ACELLULAR DERMIS;  Surgeon: Glenna Fellows, MD;  Location: [facility];  Service: Plastics;  Laterality: Bilateral;   COLONOSCOPY N/A 12/09/2018   Procedure: COLONOSCOPY;  Surgeon: West Bali, MD;  [facility];  Service: Endoscopy;  Laterality: N/A;  1:00pm   COLONOSCOPY N/A 10/20/2019   Procedure: COLONOSCOPY;  Surgeon: West Bali, MD;  Location: [facility];   Service: Endoscopy;  Laterality: N/A;  10:30   COLONOSCOPY WITH PROPOFOL N/A 06/12/2023   Procedure: COLONOSCOPY WITH PROPOFOL;  Surgeon: Lanelle Bal, DO;  [facility]: [facility];  Service: Endoscopy;  Laterality: N/A;  10:00AM, ASA 2   LIPOSUCTION WITH LIPOFILLING Bilateral 08/30/2018   Procedure: LIPOFILLING FROM ABDOMEN TO BILATERAL CHEST;  Surgeon: Glenna Fellows, MD;  Location: [facility];  Service: Plastics;  Laterality: Bilateral;   MASTECTOMY W/ SENTINEL NODE BIOPSY Bilateral 05/14/2018   Procedure: BILATERAL TOTAL MASTECTOMIES WITH BILATERAL SENTINEL LYMPH NODE BIOPSIES;  Surgeon: Emelia Loron, MD;  Location: [facility];  Service: General;  Laterality: Bilateral;   POLYPECTOMY  12/09/2018   Procedure: POLYPECTOMY;  Surgeon: West Bali, MD;  [facility]: [facility];  Service: Endoscopy;;   POLYPECTOMY  10/20/2019   Procedure: POLYPECTOMY;  Surgeon: West Bali, MD;  [facility];  Service: Endoscopy;;   POLYPECTOMY  06/12/2023   Procedure: POLYPECTOMY;  Surgeon: Lanelle Bal, DO;  [facility];  Service: Endoscopy;;   REMOVAL OF BILATERAL TISSUE EXPANDERS WITH PLACEMENT OF BILATERAL BREAST IMPLANTS Bilateral 08/30/2018   Procedure: REMOVAL OF BILATERAL TISSUE EXPANDERS WITH PLACEMENT OF BILATERAL BREAST IMPLANTS;  Surgeon: Glenna Fellows, MD;  Location: [facility];  Service: Plastics;  Laterality: Bilateral;   TISSUE EXPANDER PLACEMENT Left 06/14/2018   Procedure: removal and replacement left chest tissue expander;  Surgeon: Glenna Fellows, MD;  Location: [facility];  Service: Plastics;  Laterality: Left;    Current Outpatient Medications  Medication Sig Dispense Refill   acetaminophen (TYLENOL) 500 MG tablet Take 500-1,000 mg by mouth every 6 (six) hours as needed (pain.).     ALPRAZolam (XANAX) 0.25 MG tablet Take 0.25 mg by mouth daily as needed for anxiety.     Ascorbic  Acid (VITAMIN C) 1000 MG tablet Take 1,000 mg by mouth in the morning.     aspirin EC 81 MG tablet Take 81 mg by mouth every evening.     aspirin-acetaminophen-caffeine (EXCEDRIN MIGRAINE) 250-250-65 MG tablet Take 1-2 tablets by mouth 2 (two) times daily as needed for headache.     atorvastatin (LIPITOR) 10 MG tablet Take 10 mg by mouth at bedtime.     docusate sodium (COLACE) 100 MG capsule Take 1 capsule (100 mg total) by mouth 2 (two) times daily. 10 capsule    escitalopram (LEXAPRO) 20 MG tablet Take 20 mg by mouth in the morning.     fexofenadine (ALLEGRA) 180 MG tablet Take 180 mg by mouth in the morning.     liver oil-zinc oxide (DESITIN) 40 % ointment Apply 1 Application topically as needed for irritation.     metFORMIN (GLUCOPHAGE) 500 MG tablet Take 500 mg by mouth in the morning and at bedtime.     Multiple Vitamins-Minerals (MULTIVITAMIN WITH MINERALS) tablet Take 1 tablet by mouth every evening. Multivitamin for Adults 50+     Polyethyl Glyc-Propyl Glyc PF (SYSTANE ULTRA PF) 0.4-0.3 % SOLN Place 1-2 drops into both eyes 3 (three) times daily as needed (dry/irritated eyes.).     PROCTOFOAM HC rectal foam Place 1 applicator rectally 2 (two) times daily as needed (discomfort/irritation.).     shark liver oil-cocoa butter (PREPARATION H) 0.25-3-85.5 % suppository Place 1 suppository rectally as needed for hemorrhoids.     Simethicone (GAS-X PO) Take 2 capsules by mouth at bedtime.     SUMAtriptan (IMITREX) 100 MG tablet Take 100 mg by mouth every 2 (two) hours as needed for migraine.     tamoxifen (NOLVADEX) 20 MG tablet TAKE 1 TABLET BY MOUTH EVERY EVENING 90 tablet 3   vitamin B-12 (CYANOCOBALAMIN) 1000 MCG tablet Take 1 tablet (1,000 mcg total) by mouth daily.     No current facility-administered medications for this visit.    Allergies as of 10/24/2023 - Review Complete 10/24/2023  Allergen Reaction Noted   Amoxil [amoxicillin] Other (See Comments) 06/08/2023   Femara  [letrozole] Rash 06/08/2023   Penicillins Other (See Comments) 06/26/2016    Family History  Problem Relation Age of Onset   Lung cancer Mother 69   Other Maternal Grandmother        pituitary tumor, not cancer   Parkinson's disease Maternal Grandfather    Heart disease Maternal Uncle    Cancer Maternal Uncle        sarcoma   Heart disease Paternal Aunt    Breast cancer Other 69   Breast cancer Other 50   Breast cancer Other 80   Lymphoma Other    Lung cancer Cousin    Colon cancer Neg Hx    Gastric cancer Neg Hx    Esophageal cancer Neg Hx     Social History   Socioeconomic History   Marital status: Divorced    Spouse name: Not on file   Number of children: Not on file   Years of education: Not on file   Highest education level: Not on file  Occupational History   Not  on file  Tobacco Use   Smoking status: Never   Smokeless tobacco: Never  Vaping Use   Vaping status: Never Used  Substance and Sexual Activity   Alcohol use: Yes    Alcohol/week: 2.0 standard drinks of alcohol    Types: 2 Glasses of wine per week    Comment: occass   Drug use: Never   Sexual activity: Not Currently  Other Topics Concern   Not on file  Social History Narrative   Not on file   Social Determinants of Health   Financial Resource Strain: Not on file  Food Insecurity: No Food Insecurity (10/23/2019)   [facility] from [facility]   Hunger Vital Sign    Worried About Running Out of Food in the Last Year: Never true    Ran Out of Food in the Last Year: Never true  Transportation Needs: No Transportation Needs (10/23/2019)   Received from [facility], [facility]   [facility] - Transportation    Lack of Transportation (Medical): No    Lack of Transportation (Non-Medical): No  Physical Activity: Not on file  Stress: No Stress Concern Present (10/23/2019)   Received from [facility], [facility] -  Occupational Stress Questionnaire    Feeling of Stress : Not at all  Social Connections: Not on file    Subjective: Review of Systems  Constitutional:  Negative for chills and fever.  HENT:  Negative for congestion and hearing loss.   Eyes:  Negative for blurred vision and double vision.  Respiratory:  Negative for cough and shortness of breath.   Cardiovascular:  Negative for chest pain and palpitations.  Gastrointestinal:  Negative for abdominal pain, blood in stool, constipation, diarrhea, heartburn, melena and vomiting.  Genitourinary:  Negative for dysuria and urgency.  Musculoskeletal:  Negative for joint pain and myalgias.  Skin:  Negative for itching and rash.  Neurological:  Negative for dizziness and headaches.  Psychiatric/Behavioral:  Negative for depression. The patient is not nervous/anxious.      Objective: BP 119/76 (BP Location: Left Arm, Patient Position: Sitting, Cuff Size: Large)   Pulse 83   Ht 5\' 5"  (1.651 m)   Wt 202 lb 9.6 oz (91.9 kg)   BMI 33.71 kg/m  Physical Exam Constitutional:      Appearance: Normal appearance.  HENT:     Head: Normocephalic and atraumatic.  Eyes:     Extraocular Movements: Extraocular movements intact.     Conjunctiva/sclera: Conjunctivae normal.  Cardiovascular:     Rate and Rhythm: Normal rate and regular rhythm.  Pulmonary:     Effort: Pulmonary effort is normal.     Breath sounds: Normal breath sounds.  Abdominal:     General: Bowel sounds are normal.     Palpations: Abdomen is soft.  Musculoskeletal:        General: No swelling. Normal range of motion.     Cervical back: Normal range of motion and neck supple.  Skin:    General: Skin is warm and dry.     Coloration: Skin is not jaundiced.  Neurological:     General: No focal deficit present.     Mental Status: She is alert and oriented to person, place, and time.  Psychiatric:        Mood and Affect: Mood normal.        Behavior: Behavior normal.       Assessment: *Chronic constipation *  Abdominal bloating *Adenomatous colon polyps  Plan: Patient's constipation appears to be well-controlled on Colace twice daily.  Recommend she add Benefiber to her regimen and see how she does.  Show she is drinking at least 6 glasses water daily.  Colonoscopy recall 2027.  Follow-up in 4 months.  10/24/2023 3:12 PM   Disclaimer: This note was dictated with voice recognition software. Similar sounding words can inadvertently be transcribed and may not be corrected upon review.

## 2023-10-24 NOTE — Patient Instructions (Signed)
Continue on Colace twice daily for your constipation.  Would recommend adding Benefiber as well to this to see how you do.  You will be due for repeat colonoscopy in 3 years.  Follow-up in 3 months.  It was very nice seeing you again today.  I hope you have a great Christmas season.  Dr. Marletta Lor

## 2023-12-12 ENCOUNTER — Encounter (INDEPENDENT_AMBULATORY_CARE_PROVIDER_SITE_OTHER): Payer: 59 | Admitting: Ophthalmology

## 2023-12-12 DIAGNOSIS — H33303 Unspecified retinal break, bilateral: Secondary | ICD-10-CM | POA: Diagnosis not present

## 2023-12-12 DIAGNOSIS — H2513 Age-related nuclear cataract, bilateral: Secondary | ICD-10-CM

## 2023-12-12 DIAGNOSIS — H43813 Vitreous degeneration, bilateral: Secondary | ICD-10-CM | POA: Diagnosis not present

## 2023-12-12 DIAGNOSIS — H33102 Unspecified retinoschisis, left eye: Secondary | ICD-10-CM | POA: Diagnosis not present

## 2024-01-15 ENCOUNTER — Encounter: Payer: Self-pay | Admitting: Internal Medicine

## 2024-05-31 ENCOUNTER — Other Ambulatory Visit: Payer: Self-pay | Admitting: Hematology and Oncology

## 2024-06-02 ENCOUNTER — Telehealth: Payer: Self-pay

## 2024-06-02 NOTE — Telephone Encounter (Signed)
 Received RX refill request for Tamoxifen  20 mg for pt. Per MD note 06/2023, pt was to complete Tamoxifen  at that time. Called to see if she requested this. Unable to reach pt and VM box is full.

## 2024-06-25 ENCOUNTER — Encounter: Payer: Self-pay | Admitting: Adult Health

## 2024-12-08 ENCOUNTER — Encounter (INDEPENDENT_AMBULATORY_CARE_PROVIDER_SITE_OTHER): Payer: 59 | Admitting: Ophthalmology

## 2024-12-17 ENCOUNTER — Encounter (INDEPENDENT_AMBULATORY_CARE_PROVIDER_SITE_OTHER): Admitting: Ophthalmology
